# Patient Record
Sex: Female | Born: 1960 | Race: White | Hispanic: No | Marital: Married | Smoking: Never smoker
Health system: Southern US, Community
[De-identification: ages and names within clinical notes are randomized; demographics above are authoritative.]

## PROBLEM LIST (undated history)

## (undated) DIAGNOSIS — D279 Benign neoplasm of unspecified ovary: Secondary | ICD-10-CM

## (undated) DIAGNOSIS — R011 Cardiac murmur, unspecified: Secondary | ICD-10-CM

## (undated) DIAGNOSIS — N390 Urinary tract infection, site not specified: Secondary | ICD-10-CM

## (undated) DIAGNOSIS — L719 Rosacea, unspecified: Secondary | ICD-10-CM

## (undated) DIAGNOSIS — B019 Varicella without complication: Secondary | ICD-10-CM

## (undated) DIAGNOSIS — R6510 Systemic inflammatory response syndrome (SIRS) of non-infectious origin without acute organ dysfunction: Secondary | ICD-10-CM

## (undated) DIAGNOSIS — E119 Type 2 diabetes mellitus without complications: Secondary | ICD-10-CM

## (undated) DIAGNOSIS — I2699 Other pulmonary embolism without acute cor pulmonale: Secondary | ICD-10-CM

## (undated) DIAGNOSIS — I10 Essential (primary) hypertension: Secondary | ICD-10-CM

## (undated) HISTORY — DX: Benign neoplasm of unspecified ovary: D27.9

## (undated) HISTORY — DX: Rosacea, unspecified: L71.9

## (undated) HISTORY — DX: Systemic inflammatory response syndrome (sirs) of non-infectious origin without acute organ dysfunction: R65.10

## (undated) HISTORY — DX: Other pulmonary embolism without acute cor pulmonale: I26.99

## (undated) HISTORY — PX: TONSILLECTOMY: SUR1361

## (undated) HISTORY — DX: Type 2 diabetes mellitus without complications: E11.9

## (undated) HISTORY — DX: Essential (primary) hypertension: I10

## (undated) HISTORY — DX: Urinary tract infection, site not specified: N39.0

## (undated) HISTORY — DX: Cardiac murmur, unspecified: R01.1

## (undated) HISTORY — DX: Varicella without complication: B01.9

---

## 2016-02-19 DIAGNOSIS — D279 Benign neoplasm of unspecified ovary: Secondary | ICD-10-CM

## 2016-02-19 HISTORY — DX: Benign neoplasm of unspecified ovary: D27.9

## 2016-03-02 ENCOUNTER — Encounter: Payer: Self-pay | Admitting: Emergency Medicine

## 2016-03-02 ENCOUNTER — Emergency Department: Payer: 59

## 2016-03-02 ENCOUNTER — Emergency Department
Admission: EM | Admit: 2016-03-02 | Discharge: 2016-03-02 | DRG: 761 | Payer: 59 | Attending: Surgery | Admitting: Surgery

## 2016-03-02 ENCOUNTER — Inpatient Hospital Stay: Payer: 59

## 2016-03-02 DIAGNOSIS — R1900 Intra-abdominal and pelvic swelling, mass and lump, unspecified site: Secondary | ICD-10-CM | POA: Diagnosis present

## 2016-03-02 DIAGNOSIS — R109 Unspecified abdominal pain: Secondary | ICD-10-CM

## 2016-03-02 DIAGNOSIS — N852 Hypertrophy of uterus: Secondary | ICD-10-CM | POA: Insufficient documentation

## 2016-03-02 DIAGNOSIS — R1031 Right lower quadrant pain: Secondary | ICD-10-CM | POA: Diagnosis present

## 2016-03-02 DIAGNOSIS — R000 Tachycardia, unspecified: Secondary | ICD-10-CM | POA: Diagnosis not present

## 2016-03-02 DIAGNOSIS — R1903 Right lower quadrant abdominal swelling, mass and lump: Secondary | ICD-10-CM | POA: Diagnosis not present

## 2016-03-02 DIAGNOSIS — R509 Fever, unspecified: Secondary | ICD-10-CM | POA: Diagnosis not present

## 2016-03-02 DIAGNOSIS — K760 Fatty (change of) liver, not elsewhere classified: Secondary | ICD-10-CM | POA: Insufficient documentation

## 2016-03-02 DIAGNOSIS — K8020 Calculus of gallbladder without cholecystitis without obstruction: Secondary | ICD-10-CM | POA: Diagnosis not present

## 2016-03-02 DIAGNOSIS — D72829 Elevated white blood cell count, unspecified: Secondary | ICD-10-CM | POA: Diagnosis not present

## 2016-03-02 DIAGNOSIS — N838 Other noninflammatory disorders of ovary, fallopian tube and broad ligament: Secondary | ICD-10-CM

## 2016-03-02 DIAGNOSIS — A419 Sepsis, unspecified organism: Secondary | ICD-10-CM | POA: Diagnosis not present

## 2016-03-02 LAB — COMPREHENSIVE METABOLIC PANEL
ALT: 88 U/L — ABNORMAL HIGH (ref 14–54)
ANION GAP: 14 (ref 5–15)
AST: 61 U/L — AB (ref 15–41)
Albumin: 2.5 g/dL — ABNORMAL LOW (ref 3.5–5.0)
Alkaline Phosphatase: 89 U/L (ref 38–126)
BILIRUBIN TOTAL: 1.2 mg/dL (ref 0.3–1.2)
BUN: 9 mg/dL (ref 6–20)
CHLORIDE: 97 mmol/L — AB (ref 101–111)
CO2: 20 mmol/L — ABNORMAL LOW (ref 22–32)
Calcium: 8.8 mg/dL — ABNORMAL LOW (ref 8.9–10.3)
Creatinine, Ser: 0.58 mg/dL (ref 0.44–1.00)
Glucose, Bld: 324 mg/dL — ABNORMAL HIGH (ref 65–99)
POTASSIUM: 4 mmol/L (ref 3.5–5.1)
Sodium: 131 mmol/L — ABNORMAL LOW (ref 135–145)
TOTAL PROTEIN: 7.9 g/dL (ref 6.5–8.1)

## 2016-03-02 LAB — URINALYSIS COMPLETE WITH MICROSCOPIC (ARMC ONLY)
Bilirubin Urine: NEGATIVE
LEUKOCYTES UA: NEGATIVE
NITRITE: NEGATIVE
PH: 5 (ref 5.0–8.0)
Protein, ur: 30 mg/dL — AB
Specific Gravity, Urine: 1.025 (ref 1.005–1.030)

## 2016-03-02 LAB — PROTIME-INR
INR: 1.08
Prothrombin Time: 14.2 seconds (ref 11.4–15.0)

## 2016-03-02 LAB — CBC
HEMATOCRIT: 35.9 % (ref 35.0–47.0)
HEMOGLOBIN: 11.9 g/dL — AB (ref 12.0–16.0)
MCH: 31.4 pg (ref 26.0–34.0)
MCHC: 33.1 g/dL (ref 32.0–36.0)
MCV: 95 fL (ref 80.0–100.0)
Platelets: 277 10*3/uL (ref 150–440)
RBC: 3.78 MIL/uL — AB (ref 3.80–5.20)
RDW: 13.6 % (ref 11.5–14.5)
WBC: 19.6 10*3/uL — AB (ref 3.6–11.0)

## 2016-03-02 LAB — LACTIC ACID, PLASMA: LACTIC ACID, VENOUS: 0.8 mmol/L (ref 0.5–1.9)

## 2016-03-02 LAB — LIPASE, BLOOD: LIPASE: 18 U/L (ref 11–51)

## 2016-03-02 LAB — POCT PREGNANCY, URINE: PREG TEST UR: NEGATIVE

## 2016-03-02 MED ORDER — ONDANSETRON HCL 4 MG/2ML IJ SOLN: 4.0000 mg | Freq: Four times a day (QID) | INTRAMUSCULAR | Status: DC | PRN

## 2016-03-02 MED ORDER — PIPERACILLIN-TAZOBACTAM 3.375 G IVPB: 3.3750 g | Freq: Three times a day (TID) | INTRAVENOUS | Status: DC

## 2016-03-02 MED ORDER — ONDANSETRON 4 MG PO TBDP: 4.0000 mg | ORAL_TABLET | Freq: Four times a day (QID) | ORAL | Status: DC | PRN

## 2016-03-02 MED ORDER — ENOXAPARIN SODIUM 40 MG/0.4ML ~~LOC~~ SOLN: 40.0000 mg | SUBCUTANEOUS | Status: DC

## 2016-03-02 MED ORDER — SODIUM CHLORIDE 0.9 % IV BOLUS (SEPSIS): 1000.0000 mL | Freq: Once | INTRAVENOUS | Status: DC

## 2016-03-02 MED ORDER — MORPHINE SULFATE (PF) 4 MG/ML IV SOLN
INTRAVENOUS | Status: AC
  Administered 2016-03-02: 4 mg via INTRAVENOUS
  Filled 2016-03-02: qty 1

## 2016-03-02 MED ORDER — FAMOTIDINE IN NACL 20-0.9 MG/50ML-% IV SOLN: 20.0000 mg | Freq: Two times a day (BID) | INTRAVENOUS | Status: DC

## 2016-03-02 MED ORDER — HYDROMORPHONE HCL 1 MG/ML IJ SOLN
1.0000 mg | INTRAMUSCULAR | Status: DC | PRN
  Administered 2016-03-02: 1 mg via INTRAVENOUS
  Filled 2016-03-02: qty 1

## 2016-03-02 MED ORDER — ACETAMINOPHEN 325 MG PO TABS: 650.0000 mg | ORAL_TABLET | Freq: Four times a day (QID) | ORAL | Status: DC | PRN

## 2016-03-02 MED ORDER — HYDROCODONE-ACETAMINOPHEN 5-325 MG PO TABS: 1.0000 | ORAL_TABLET | ORAL | Status: DC | PRN

## 2016-03-02 MED ORDER — SODIUM CHLORIDE 0.9 % IV BOLUS (SEPSIS): 500.0000 mL | Freq: Once | INTRAVENOUS | Status: DC

## 2016-03-02 MED ORDER — DIATRIZOATE MEGLUMINE & SODIUM 66-10 % PO SOLN
15.0000 mL | Freq: Once | ORAL | Status: AC
  Administered 2016-03-02: 15 mL via ORAL

## 2016-03-02 MED ORDER — SODIUM CHLORIDE 0.9 % IV BOLUS (SEPSIS)
1000.0000 mL | INTRAVENOUS | Status: AC
  Administered 2016-03-02: 1000 mL via INTRAVENOUS

## 2016-03-02 MED ORDER — ACETAMINOPHEN 650 MG RE SUPP: 650.0000 mg | Freq: Four times a day (QID) | RECTAL | Status: DC | PRN

## 2016-03-02 MED ORDER — PIPERACILLIN-TAZOBACTAM 3.375 G IVPB 30 MIN
3.3750 g | Freq: Once | INTRAVENOUS | Status: AC
  Administered 2016-03-02: 3.375 g via INTRAVENOUS
  Filled 2016-03-02: qty 50

## 2016-03-02 MED ORDER — IOPAMIDOL (ISOVUE-300) INJECTION 61%
100.0000 mL | Freq: Once | INTRAVENOUS | Status: AC | PRN
  Administered 2016-03-02: 100 mL via INTRAVENOUS

## 2016-03-02 MED ORDER — MORPHINE SULFATE (PF) 4 MG/ML IV SOLN
4.0000 mg | Freq: Once | INTRAVENOUS | Status: AC
  Administered 2016-03-02: 4 mg via INTRAVENOUS

## 2016-03-02 MED ORDER — ONDANSETRON HCL 4 MG/2ML IJ SOLN
4.0000 mg | Freq: Once | INTRAMUSCULAR | Status: AC
  Administered 2016-03-02: 4 mg via INTRAVENOUS

## 2016-03-02 MED ORDER — ONDANSETRON HCL 4 MG/2ML IJ SOLN
INTRAMUSCULAR | Status: AC
  Administered 2016-03-02: 4 mg via INTRAVENOUS
  Filled 2016-03-02: qty 2

## 2016-03-02 MED ORDER — KCL IN DEXTROSE-NACL 20-5-0.45 MEQ/L-%-% IV SOLN
INTRAVENOUS | Status: DC
  Administered 2016-03-02: 13:00:00 via INTRAVENOUS
  Filled 2016-03-02: qty 1000

## 2016-03-02 NOTE — ED Notes (Signed)
MD at bedside. 

## 2016-03-02 NOTE — ED Notes (Signed)
Pt reports severe abd pain in right upper quad - Pt states she has been having this pain for 3 weeks off an on  - Pt was seen in [facility] walk in Wed and was dx with UTI and took one week worth of atb - pt reports that the medication did not help with the discomfort

## 2016-03-02 NOTE — H&P (Signed)
Kimberly Rowe is a 55 y.o. female  with right lower quadrant abdominal pain.  HPI: She was in her usual state of good health until approximately 3 weeks ago when she developed the gradual onset of right lower quadrant right flank pain. The pain was constant increasing over the course of time with no other significant symptoms. Specifically, she had no nausea vomiting diarrhea or constipation. She had no GYN bleeding. She had no urinary symptoms other than some mild frequency. She was seen by a walk-in clinic and diagnosed with possible urinary tract infection placed on 2 antibiotics. She completed antibiotics yesterday.  She began to develop increasing fevers last evening and the pain worsened. Because of the change in her symptoms she presented to the [facility] for further evaluation. She denies any previous significant GI problems. Specifically, she denies history of hepatitis, yellow jaundice, pancreatitis, peptic ulcer disease, gallbladder disease, or diverticulitis. Not had a recent GYN evaluation. She's not had a previous colonoscopy or upper endoscopy.  She does relate multiple chills and fevers over the last week to 10 days. Her fever has been mitigated by medications. She's had no previous abdominal surgery. She does not have regular family physician.  Workup in the emergency room revealed elevated white blood cell count of 19,000. CT scan revealed a large right lower quadrant mass consistent with possible ovarian tumor possible ruptured appendicitis. Surgical service was consulted.  History reviewed. No pertinent past medical history. History reviewed. No pertinent past surgical history. Social History   Social History  . Marital Status: Married    Spouse Name: N/A  . Number of Children: N/A  . Years of Education: N/A   Social History Main Topics  . Smoking status: Never Smoker   . Smokeless tobacco: None  . Alcohol Use: Yes     Comment: occ  . Drug Use: No  . Sexual Activity: Yes    Other Topics Concern  . None   Social History Narrative  . None     Review of Systems  Constitutional: Positive for fever, chills and malaise/fatigue.  HENT: Negative.   Eyes: Negative.   Respiratory: Negative for cough, shortness of breath and wheezing.   Cardiovascular: Negative for chest pain and palpitations.  Gastrointestinal: Negative for heartburn, nausea, vomiting, diarrhea and constipation.  Genitourinary: Positive for frequency. Negative for dysuria and urgency.  Musculoskeletal: Negative.   Skin: Negative.   Neurological: Negative.   Psychiatric/Behavioral: Negative.      PHYSICAL EXAM: BP 159/86 mmHg  Pulse 110  Temp(Src) 98.5 F (36.9 C) (Oral)  Resp 27  Ht 5\' 9"  (1.753 m)  Wt 107.049 kg (236 lb)  BMI 34.84 kg/m2  SpO2 95%  LMP 01/19/2016 (Exact Date)  Physical Exam  Constitutional: She is oriented to person, place, and time. She appears well-developed and well-nourished.  She is flushed  HENT:  Head: Normocephalic and atraumatic.  Eyes: EOM are normal. Pupils are equal, round, and reactive to light.  Neck: Normal range of motion. Neck supple.  Cardiovascular: Normal rate and regular rhythm.   Pulmonary/Chest: Effort normal and breath sounds normal. No respiratory distress. She has no wheezes.  Abdominal: Soft. Bowel sounds are normal. She exhibits distension and mass. There is tenderness. There is guarding.  Musculoskeletal: Normal range of motion. She exhibits no edema or tenderness.  Neurological: She is alert and oriented to person, place, and time.  Skin: Skin is warm. She is diaphoretic.  Psychiatric: She has a normal mood and affect. Thought content normal.  I can't palpate the mass in the right abdomen just above the iliac crest. She has mild tenderness and guarding with no rebound. She does have active bowel sounds.  Impression/Plan: I have independently reviewed her CT scan. Clinically she appears to have an abdominal infection with fever  or chills elevated white blood cell count of 3 week history. CT raises the question of possible ovarian mass and the solid component of this fluid collection does appear to be on a plane similar to the left ovary. I am reluctant to intervene with a percutaneous drainage procedure if this lesion is indeed an ovarian neoplasm. We will get a pelvic ultrasound and arrange for GYN to see her prior to any other intervention. I discussed the problem with the patient and her husband demonstrated the CT scan with the findings to them and they're in agreement with the current plan. We will place her on antibiotic therapy as we worked this problem.   Dia Crawford III, MD  03/02/2016, 10:06 AM

## 2016-03-02 NOTE — ED Notes (Signed)
Dr. Ely at bedside.

## 2016-03-02 NOTE — ED Provider Notes (Addendum)
[facility] Emergency Department Provider Note  ____________________________________________  Time seen: Approximately 6:17 AM  I have reviewed the triage vital signs and the nursing notes.   HISTORY  Chief Complaint Abdominal Pain    HPI Kimberly Rowe is a 55 y.o. female with no significant past medical history who presents for evaluation of severe right-sided abdominal pain worse in the right upper quadrant.  She reports that she has been having pain off and on for 3 weeks but that is much more severe tonight.  She does not note any association between eating and the pain.  Nothing particular makes it better nor worse.  She describes it as both a sharp and aching pain in the right side of her abdomen.  She denies nausea, vomiting, chest pain, shortness of breath.  He did not have any dysuria but was seen in a walk-in clinic last week and diagnosed with a UTI and taken a week's worth of antibiotics.  She reports having low-grade fevers intermittently for about 3 weeks, no higher than 100.6.  Ibuprofen brings the fever down and also helped her pain until last night, but nothing was helping at that point.   History reviewed. No pertinent past medical history.  There are no active problems to display for this patient.   History reviewed. No pertinent past surgical history.  No current outpatient prescriptions on file.  Allergies Review of patient's allergies indicates no known allergies.  History reviewed. No pertinent family history.  Social History Social History  Substance Use Topics  . Smoking status: Never Smoker   . Smokeless tobacco: None  . Alcohol Use: Yes     Comment: occ    Review of Systems Constitutional: Low grade fevers Eyes: No visual changes. ENT: No sore throat. Cardiovascular: Denies chest pain. Respiratory: Denies shortness of breath. Gastrointestinal: +abdominal pain.  No nausea, no vomiting.  No diarrhea.  No  constipation. Genitourinary: Negative for dysuria. Musculoskeletal: Negative for back pain. Skin: Negative for rash. Neurological: Negative for headaches, focal weakness or numbness.  10-point ROS otherwise negative.  ____________________________________________   PHYSICAL EXAM:  VITAL SIGNS: ED Triage Vitals  Enc Vitals Group     BP 03/02/16 0542 159/88 mmHg     Pulse Rate 03/02/16 0542 121     Resp 03/02/16 0542 16     Temp 03/02/16 0542 98.5 F (36.9 C)     Temp Source 03/02/16 0542 Oral     SpO2 03/02/16 0542 96 %     Weight 03/02/16 0542 236 lb (107.049 kg)     Height 03/02/16 0542 5\' 9"  (1.753 m)     Head Cir --      Peak Flow --      Pain Score 03/02/16 0537 8     Pain Loc --      Pain Edu? --      Excl. in [place]? --     Constitutional: Alert and oriented. Well appearing and in no acute distress. Eyes: Conjunctivae are normal. PERRL. EOMI. Head: Atraumatic. Nose: No congestion/rhinnorhea. Mouth/Throat: Mucous membranes are moist.  Oropharynx non-erythematous. Neck: No stridor.  No meningeal signs.   Cardiovascular: Normal rate, regular rhythm. Good peripheral circulation. Grossly normal heart sounds.   Respiratory: Normal respiratory effort.  No retractions. Lungs CTAB. Gastrointestinal: Obese.  Soft with significant tenderness to palpation of the right side of the abdomen and specifically of the right upper quadrant with +Murphy's sign Musculoskeletal: No lower extremity tenderness nor edema. No gross deformities  of extremities. Neurologic:  Normal speech and language. No gross focal neurologic deficits are appreciated.  Skin:  Skin is warm, dry and intact. No rash noted. Psychiatric: Mood and affect are normal. Speech and behavior are normal.  ____________________________________________   LABS (all labs ordered are listed, but only abnormal results are displayed)  Labs Reviewed  COMPREHENSIVE METABOLIC PANEL - Abnormal; Notable for the following:    Sodium  131 (*)    Chloride 97 (*)    CO2 20 (*)    Glucose, Bld 324 (*)    Calcium 8.8 (*)    Albumin 2.5 (*)    AST 61 (*)    ALT 88 (*)    All other components within normal limits  CBC - Abnormal; Notable for the following:    WBC 19.6 (*)    RBC 3.78 (*)    Hemoglobin 11.9 (*)    All other components within normal limits  URINALYSIS COMPLETEWITH MICROSCOPIC ([facility] ONLY) - Abnormal; Notable for the following:    Color, Urine YELLOW (*)    APPearance HAZY (*)    Glucose, UA >500 (*)    Ketones, ur 2+ (*)    Hgb urine dipstick 3+ (*)    Protein, ur 30 (*)    Bacteria, UA RARE (*)    Squamous Epithelial / LPF 6-30 (*)    All other components within normal limits  CULTURE, BLOOD (ROUTINE X 2)  CULTURE, BLOOD (ROUTINE X 2)  URINE CULTURE  LIPASE, BLOOD  LACTIC ACID, PLASMA  LACTIC ACID, PLASMA  PROTIME-INR  POC URINE PREG, ED  POCT PREGNANCY, URINE   ____________________________________________  EKG  ED ECG REPORT I, [place], the attending physician, personally viewed and interpreted this ECG.  Date: 03/02/2016 EKG Time: 07:32 Rate: 115 Rhythm: sinus tachycardia QRS Axis: RAD Intervals: normal ST/T Wave abnormalities: normal Conduction Disturbances: none Narrative Interpretation: unremarkable  ____________________________________________  RADIOLOGY   US Abdomen Limited Ruq  03/02/2016  CLINICAL DATA:  Right upper quadrant pain off and on for 3-4 weeks. EXAM: US ABDOMEN LIMITED - RIGHT UPPER QUADRANT COMPARISON:  None. FINDINGS: Gallbladder: Multiple echogenic stones are identified measuring up to 1 cm diameter. No gallbladder wall thickness. Sonographer reports no sonographic Murphy sign. Common bile duct: Diameter: 4 mm Liver: Increased echogenicity with poor through transmission compatible with steatosis. Note: Sonographer Identifies a 13 x 11 x 14 cm complex cystic lesion in the right lower quadrant, the region of pain as reported by the patient. IMPRESSION:  1. 14 cm complex cystic mass identified in the right lower quadrant as identified as the area of concern by the patient. CT imaging of the abdomen and pelvis with oral and intravenous contrast recommended to further evaluate. 2. Cholelithiasis. 3. Steatosis Electronically Signed   By: Misty Stanley M.D.   On: 03/02/2016 07:09    ____________________________________________   PROCEDURES  Procedure(s) performed:   .Critical Care Performed by: Hinda Kehr Authorized by: Hinda Kehr Total critical care time: 30 minutes Critical care time was exclusive of separately billable procedures and treating other patients. Critical care was necessary to treat or prevent imminent or life-threatening deterioration of the following conditions: sepsis. Critical care was time spent personally by me on the following activities: development of treatment plan with patient or surrogate, discussions with consultants, evaluation of patient's response to treatment, examination of patient, obtaining history from patient or surrogate, ordering and performing treatments and interventions, ordering and review of laboratory studies, ordering and review of radiographic studies, pulse  oximetry, re-evaluation of patient's condition and review of old charts.     ____________________________________________   INITIAL IMPRESSION / ASSESSMENT AND PLAN / ED COURSE  Pertinent labs & imaging results that were available during my care of the patient were reviewed by me and considered in my medical decision making (see chart for details).  The patient was tachycardic in the 120s when she arrived but afebrile.  I have ordered 1 L of fluids, labs are pending, and I ordered an ultrasound.  If her ultrasound is unremarkable and does not show a reason for her tenderness I we will proceed with a CT of the abdomen and pelvis with oral and IV contrast.  She has no genitourinary symptoms and there is no indication for pelvic exam at  this time.  She currently is declining any pain medicine or nausea medicine.  ----------------------------------------- 7:27 AM on 03/02/2016 -----------------------------------------  Labs are concerning with a leukocytosis > 19.  AST and ALT also slightly elevated.  U/S shows no biliary pathology but a large complex cystic structure in the abdomen.  I am concerned for an intraabdominal abscess.  Since we now have a leukocytosis, she meets sepsis criteria - activating Code Sepsis, ordering empiric antibiotics (Zosyn), 30 mL/kg of IVF, ordering lactate, ordering CT abd/pelvis for further evaluation.  Patient now with more pain, giving morphine and zofran.  Transferring ED care to Dr. Reita Cliche to follow up and reassess.  ____________________________________________  FINAL CLINICAL IMPRESSION(S) / ED DIAGNOSES  Final diagnoses:  Right sided abdominal pain  Sepsis, due to unspecified organism ([place])     MEDICATIONS GIVEN DURING THIS VISIT:  Medications  sodium chloride 0.9 % bolus 1,000 mL (not administered)    And  sodium chloride 0.9 % bolus 1,000 mL (not administered)    And  sodium chloride 0.9 % bolus 500 mL (not administered)  piperacillin-tazobactam (ZOSYN) IVPB 3.375 g (not administered)  diatrizoate meglumine-sodium (GASTROGRAFIN) 66-10 % solution 15 mL (not administered)  sodium chloride 0.9 % bolus 1,000 mL (1,000 mLs Intravenous New Bag/Given 03/02/16 0657)     NEW OUTPATIENT MEDICATIONS STARTED DURING THIS VISIT:  New Prescriptions   No medications on file      Note:  This document was prepared using Dragon voice recognition software and may include unintentional dictation errors.   Hinda Kehr, MD 03/02/16 [place], MD 03/02/16 [phone]

## 2016-03-02 NOTE — Progress Notes (Signed)
Pharmacy Antibiotic Note  Kimberly Rowe is a 55 y.o. female admitted on 03/02/2016 with sepsis.  Pharmacy has been consulted for piperacillin-tazobactam dosing. Patient received pipercillin-tazobactam 3.375 g IV over 30 min once in the ER.  Plan: Zosyn 3.375g IV q8h (4 hour infusion).  Height: 5\' 9"  (175.3 cm) Weight: 236 lb (107.049 kg) IBW/kg (Calculated) : 66.2  Temp (24hrs), Avg:98.5 F (36.9 C), Min:98.5 F (36.9 C), Max:98.5 F (36.9 C)   Recent Labs Lab 03/02/16 0549 03/02/16 0748  WBC 19.6*  --   CREATININE 0.58  --   LATICACIDVEN  --  0.8    Estimated Creatinine Clearance: 105.9 mL/min (by C-G formula based on Cr of 0.58).    No Known Allergies  Antimicrobials this admission: Piperacillin-tazobactam 07/13 >>   Microbiology results: 07/13 BCx: Sent 07/13 UCx: Sent   Thank you for allowing pharmacy to be a part of this patient's care.  Darrow Bussing, PharmD 03/02/2016 12:41 PM

## 2016-03-02 NOTE — ED Notes (Signed)
Pt presents to ED with c/o right lower quadrant abdominal pain, onset x3 weeks with fever. Pt denies N/V/D or urinary symptoms at this time. Pt states she was diagnosed with UTI about a week ago; finished last  Antibiotic Tx yesterday.

## 2016-03-02 NOTE — ED Notes (Signed)
Report from Megan, RN

## 2016-03-02 NOTE — ED Notes (Signed)
Pt in [place] will hang NACL when she returns

## 2016-03-02 NOTE — ED Notes (Signed)
This RN introduced self to patient. Pt appears in no obvious distressed. Explained plan of care of care to patient. Pt states understanding at this time.

## 2016-03-02 NOTE — ED Provider Notes (Signed)
[facility]  I accepted care from Dr. Karma Greaser ____________________________________________    LABS (pertinent positives/negatives)  Labs Reviewed  COMPREHENSIVE METABOLIC PANEL - Abnormal; Notable for the following:    Sodium 131 (*)    Chloride 97 (*)    CO2 20 (*)    Glucose, Bld 324 (*)    Calcium 8.8 (*)    Albumin 2.5 (*)    AST 61 (*)    ALT 88 (*)    All other components within normal limits  CBC - Abnormal; Notable for the following:    WBC 19.6 (*)    RBC 3.78 (*)    Hemoglobin 11.9 (*)    All other components within normal limits  URINALYSIS COMPLETEWITH MICROSCOPIC ([facility] ONLY) - Abnormal; Notable for the following:    Color, Urine YELLOW (*)    APPearance HAZY (*)    Glucose, UA >500 (*)    Ketones, ur 2+ (*)    Hgb urine dipstick 3+ (*)    Protein, ur 30 (*)    Bacteria, UA RARE (*)    Squamous Epithelial / LPF 6-30 (*)    All other components within normal limits  CULTURE, BLOOD (ROUTINE X 2)  CULTURE, BLOOD (ROUTINE X 2)  URINE CULTURE  LIPASE, BLOOD  LACTIC ACID, PLASMA  PROTIME-INR  CBC  CREATININE, SERUM  POC URINE PREG, ED  POCT PREGNANCY, URINE      ____________________________________________    RADIOLOGY All xrays were viewed by me.  I discussed this case with the radiologist. Imaging interpreted by radiologist.  CT abdomen and pelvis with contrast:IMPRESSION: 1. 15 cm complex cystic and solid lesion in the right abdomen has multiple loculations of varying attenuation and some demonstrate rim calcification. Gonadal vasculature on the right tracks into the inferior portion of this lesion. 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 However, the lesion appears to incorporate the cecal tip and the appendix cannot be visualized. Primary GI etiology such as perforated appendicitis or large cecal/appendiceal neoplasm are considered possible, but less likely. Given the rim calcification in some  areas and soft tissue components, features are not typical for tubo-ovarian abscess. 2. Markedly enlarged uterus secondary to fibroid change.  Electronically Signed By: Misty Stanley M.D. ____________________________________________   PROCEDURES  Procedure(s) performed: None  Critical Care performed: None  ____________________________________________   INITIAL IMPRESSION / ASSESSMENT AND PLAN / ED COURSE   Pertinent labs & imaging results that were available during my care of the patient were reviewed by me and considered in my medical decision making (see chart for details).  I discussed ct results with radiologist -- unclear etiology, but consider tumor of ovarian source or colon source, or ruptured appendix.  Ultimately the patient is well-appearing without hypertension or elevated lactate and I think sepsis is unlikely.  I gave the patient and her spouse the CT results. I spoke with Dr. Star Age with OB/GYN who stated that here at [facility] a gynecologic oncologist surgeon would not be available until next Wednesday, which is likely too long to delay evaluation and recommends transfer to tertiary care center.  Dr. Pat Patrick with Gen. surgery did evaluate the patient and was willing to admit, but given that by an gyn onc surgery is not available for one week, agreed that transfer is the best choice for this patient.   I spoke to the [facility] transfer center and discussed the case with Dr. Warden Fillers, general surgery who will accept the patient to be  transferred to the [facility] emergency Department where an evaluation and discussion of service and additional workup will take place.  CONSULTATIONS: Dr. Pat Patrick, general surgery and Dr. Georgianne Fick ob gyn.  Dr. Rolena Infante, [facility] surgery accepted in transfer.    Patient / Family / Caregiver informed of clinical course, medical decision-making process, and agree with plan.     ____________________________________________   FINAL CLINICAL  IMPRESSION(S) / ED DIAGNOSES  Final diagnoses:  Right sided abdominal pain  Sepsis, due to unspecified organism [facility])  Ovarian mass, right        Lisa Roca, MD 03/02/16 1234

## 2016-03-02 NOTE — ED Notes (Addendum)
Pt now to be transferred, not admitted.  Pt verbalizes understanding.

## 2016-03-03 LAB — URINE CULTURE

## 2016-03-06 HISTORY — PX: ABDOMINAL HYSTERECTOMY: SHX81

## 2016-03-07 LAB — CULTURE, BLOOD (ROUTINE X 2)
CULTURE: NO GROWTH
Culture: NO GROWTH

## 2016-03-22 ENCOUNTER — Inpatient Hospital Stay: Payer: 59 | Attending: Obstetrics and Gynecology | Admitting: Obstetrics and Gynecology

## 2016-03-22 ENCOUNTER — Telehealth: Payer: Self-pay | Admitting: *Deleted

## 2016-03-22 ENCOUNTER — Encounter: Payer: Self-pay | Admitting: Obstetrics and Gynecology

## 2016-03-22 VITALS — BP 169/108 | HR 105

## 2016-03-22 DIAGNOSIS — D279 Benign neoplasm of unspecified ovary: Secondary | ICD-10-CM | POA: Insufficient documentation

## 2016-03-22 DIAGNOSIS — E119 Type 2 diabetes mellitus without complications: Secondary | ICD-10-CM | POA: Insufficient documentation

## 2016-03-22 DIAGNOSIS — R011 Cardiac murmur, unspecified: Secondary | ICD-10-CM | POA: Diagnosis not present

## 2016-03-22 DIAGNOSIS — Z90710 Acquired absence of both cervix and uterus: Secondary | ICD-10-CM | POA: Diagnosis not present

## 2016-03-22 DIAGNOSIS — Z90722 Acquired absence of ovaries, bilateral: Secondary | ICD-10-CM | POA: Insufficient documentation

## 2016-03-22 DIAGNOSIS — N7003 Acute salpingitis and oophoritis: Secondary | ICD-10-CM | POA: Diagnosis not present

## 2016-03-22 DIAGNOSIS — D270 Benign neoplasm of right ovary: Secondary | ICD-10-CM | POA: Insufficient documentation

## 2016-03-22 DIAGNOSIS — I10 Essential (primary) hypertension: Secondary | ICD-10-CM | POA: Diagnosis not present

## 2016-03-22 NOTE — Telephone Encounter (Signed)
Contacted husband regarding BP recheck he reports BP is now 138/80 per home device. He also reconciled medications with me utilizing wife's pill bottles. See med list.

## 2016-03-22 NOTE — Progress Notes (Signed)
Patient here for post op follow up and suture removal. Patient BP is elevated advised her to monitor it daily and notify PCP if it remains elevated.  Sutures removed steri strips applied patient instructed to leave strips in place and let fall off naturally.

## 2016-03-22 NOTE — Progress Notes (Signed)
Gynecologic Oncology  Visit     Chief Concern: staple and drain removal  Subjective:  Kimberly Rowe is a 55 y.o. female who was admitted through the [place] with RLQ pain, fevers, and leukocytosis who was found to have a large pelvic mass concerning for ovarian neoplasm and/or an intra-abdominal infectious process with SIRS. She underwent uncomplicated Ex-lap, TAH, RSO, and drain placement on 03/06/16 for grossly infected benign ovarian mass. She was treated with IV antibiotics that were transitioned to a course of oral flagyl/augmentin.  Patient reports no PMH although diagnosed during hospitalization with hypertension and diabetes.   Postoperatively she had a left PE diagnosed on CT/PE on 03/10/16. She was started heparin drip, transitioned to Lovenox, and later to Eloquis. She is supposed to follow up with Dr. Derrel Nip.    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  DIAGNOSIS A. Uterus (1692 grams), hysterectomy:  Endometrium: Atrophic endometrium. Myometrium: Leiomyomata. Endocervix: Endocervical polyp and microglandular hyperplasia. Serosa: No pathologic diagnosis.   B. Cervix, excision:  Benign cervix. No dysplasia or carcinoma is seen.   C. 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 Fallopian tube: Acute and chronic salpingitis with serositis. Negative for malignancy.   D. Inflammatory rind, excision:   Fibroadipose tissue with acute and chronic inflammation.   E. Left fallopian tube, salpingectomy:  Benign fallopian tube with acute and chronic salpingitis with acute serositis.    Cytology: Negative.   She has done well since discharge. Drain output minimally 20 cc/3 days. No fever, chills, or other concerns. She is anxious about the appointment but has no other complaints.        Problem List: Patient Active Problem  List   Diagnosis Date Noted  . 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 Pelvic mass in female 03/02/2016    Past Medical History: Past Medical History:  Diagnosis Date  . Diabetes mellitus without complication ([place])   . Heart murmur   . Hypertension   . 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 Struma ovarii 02/2016           Past Surgical History: Past Surgical History:  Procedure Laterality Date  . ABDOMINAL HYSTERECTOMY  03/06/2016   Ex-lap, TAH, RSO, and drain placement on 03/06/16 for grossly infected benign ovarian mass.           Past Gynecologic History:  As per HPI  Social History: Social History   Social History  . Marital status: Married    Spouse name: N/A  . Number of children: N/A  . Years of education: N/A   Occupational History  . Not on file.   Social History Main Topics  . Smoking status: Never Smoker  . Smokeless tobacco: Not on file  . Alcohol use Yes     Comment: occ  . Drug use: No  . Sexual activity: Yes   Other Topics Concern  . Not on file   Social History Narrative  . No narrative on file    Allergies: No Known Allergies   Review of Systems As per HPI  Objective:  Physical Examination:  BP (!) 169/108 (BP Location: Right Arm, Patient Position: Sitting) Comment: patient will monitor BP at home and notify PCP if it remains elevated.  Pulse (!) 105     BP retake at home improved.    ECOG Performance  Status: 0 - Asymptomatic  General appearance: alert, cooperative and appears stated age HEENT:PERRLA and extra ocular movement intact Abdomen: soft, non-tender, without masses or organomegaly, no hernias and well healed incision. Staples removed and reinforced with steri strips.  Neurological exam reveals alert, oriented, normal speech, no focal findings or movement disorder noted.  Pelvic: deferred  Drains removed without complication; sterile dressing applied.         Assessment:  Kimberly Rowe is a 55 y.o. female diagnosed with struma ovarii s/p XL, TAH, RSO, and drain placement on 03/06/16.  Medical co-morbidities complicating care: PE, HTN, diabetes and obesity.  Plan:   Problem List Items Addressed This Visit      Genitourinary   Struma ovarii - Primary    Other Visit Diagnoses   None.     She will follow up with Dr. Fransisca Connors as scheduled for her postoperative visit.  We will try to help arrange for her appointment with Dr. Derrel Nip to manage her BP, DM, and anticoagulation.   Gillis Ends, MD    CC:  No referring provider defined for this encounter.

## 2016-03-24 ENCOUNTER — Encounter: Payer: Self-pay | Admitting: Obstetrics and Gynecology

## 2016-04-07 ENCOUNTER — Other Ambulatory Visit: Payer: Self-pay | Admitting: *Deleted

## 2016-04-07 NOTE — Telephone Encounter (Signed)
Advised patient to call [facility] for this refill as we did not order this for her. I gave her the number to Dr Roxy Horseman office

## 2016-04-19 ENCOUNTER — Inpatient Hospital Stay: Payer: 59 | Admitting: Obstetrics and Gynecology

## 2016-04-19 ENCOUNTER — Encounter (INDEPENDENT_AMBULATORY_CARE_PROVIDER_SITE_OTHER): Payer: Self-pay

## 2016-04-19 VITALS — BP 160/97 | HR 97 | Temp 99.1°F | Wt 217.0 lb

## 2016-04-19 DIAGNOSIS — R1900 Intra-abdominal and pelvic swelling, mass and lump, unspecified site: Secondary | ICD-10-CM

## 2016-04-19 NOTE — Progress Notes (Signed)
  Oncology Nurse Navigator Documentation  Navigator Location: [facility] (04/19/16 1400) Navigator Encounter Type: Follow-up Appt (04/19/16 1400)                                          Time Spent with Patient: 30 (04/19/16 1400)   Chaperoned pelvic exam. No Gyn Onc follow up needed unless any issues arise. Has Hematology appt for anticoag regulation and internal med for primary care to include BP and diabetes.

## 2016-04-19 NOTE — Progress Notes (Signed)
Patient ambulates without assistance, brought to exam room 14 accompanied by her husband Patient denies pain or discomfort. BP 106/97, HR 97 Tamp 99.1

## 2016-04-19 NOTE — Progress Notes (Signed)
Gynecologic Oncology  Visit   Chief Concern: Post op exam  Subjective:  Kimberly Rowe is a 55 y.o. female who was admitted through the [facility] with RLQ pain, fevers, and leukocytosis who was found to have a large pelvic mass concerning for ovarian neoplasm and/or an intra-abdominal infectious process with SIRS. She underwent uncomplicated Ex-lap, TAH, RSO, and drain placement on 03/06/16 for grossly infected benign ovarian mass. She was treated with IV antibiotics that were transitioned to a course of oral flagyl/augmentin.  Patient reports no PMH although diagnosed during hospitalization with hypertension and diabetes.   Postoperatively she had a left PE diagnosed on CT/PE on 03/10/16. She was started heparin drip, transitioned to Lovenox, and later to Eloquis. She is supposed to follow up with Dr. Derrel Nip.   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 Uterus negative.  Cytology: Negative.   She has done well since discharge and was seen for removal of drain by Dr Theora Gianotti 8/2.  No fever, chills, or other concerns since then.   Problem List: Patient Active Problem List   Diagnosis Date Noted  . 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 Pelvic mass in female 03/02/2016    Past Medical History: Past Medical History:  Diagnosis Date  . Diabetes mellitus without complication ([place])   . Heart murmur   . Hypertension   . 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 Struma ovarii 02/2016    Past Surgical History: Past Surgical History:  Procedure Laterality Date  . ABDOMINAL HYSTERECTOMY  03/06/2016   Ex-lap, TAH, RSO, and drain placement on 03/06/16 for grossly infected benign ovarian mass.     Past Gynecologic History:  As per HPI  Social History: Social History   Social History  . Marital status: Married    Spouse name: N/A  . Number of children: N/A  . Years of education: N/A   Occupational History  . Not  on file.   Social History Main Topics  . Smoking status: Never Smoker  . Smokeless tobacco: Not on file  . Alcohol use Yes     Comment: occ  . Drug use: No  . Sexual activity: Yes   Other Topics Concern  . Not on file   Social History Narrative  . No narrative on file    Allergies: No Known Allergies   Current Outpatient Prescriptions:  .  metFORMIN (GLUCOPHAGE-XR) 500 MG 24 hr tablet, 500 mg., Disp: , Rfl:  .  apixaban (ELIQUIS) 5 MG TABS tablet, Take by mouth., Disp: , Rfl:  .  insulin glargine (LANTUS) 100 UNIT/ML injection, Inject 40 Units into the skin at bedtime., Disp: , Rfl:  .  linagliptin (TRADJENTA) 5 MG TABS tablet, Take by mouth., Disp: , Rfl:    Review of Systems As per HPI  Objective:  Physical Examination:   Vitals:   04/19/16 1423  BP: (!) 160/97  Pulse: 97  Temp: 99.1 F (37.3 C)      ECOG Performance Status: 0 - Asymptomatic  General appearance: alert, cooperative and appears stated age HEENT:PERRLA and extra ocular movement intact Abdomen: soft, non-tender, without masses or organomegaly, no hernias and well healed incision.   Neurological exam reveals alert, oriented, normal speech, no focal findings or movement disorder noted.  Pelvic: EGBUS: normal, vagina: cuff well healed, bimanual: no masses.     Assessment:  Kimberly Rowe is a 55 y.o. female diagnosed with dermoid with struma  ovarii s/p XL, TAH, RSO, and drain placement on 03/06/16.   Medical co-morbidities complicating care: PE post op, HTN, diabetes and obesity.   BP remains high today.  She is not on any medications, and says her BP is normal at home and at pharmacy.  Plan:   Problem List Items Addressed This Visit      Other   Pelvic mass in female - Primary    Other Visit Diagnoses   None.    She will follow up with medical doctor (Dr Lacinda Axon) for her BP (she says it is normal at home) and DM.  She has an appointment in the [place] clinic next week for follow up of  her anticoagulation for post op PE.   We can see her back in the future as needed.  She may return to work now.   Mellody Drown, MD  CC:  Coral Spikes, DO [address] [place], [place] [address]

## 2016-05-01 ENCOUNTER — Ambulatory Visit: Payer: 59 | Admitting: Family Medicine

## 2016-05-03 ENCOUNTER — Ambulatory Visit (INDEPENDENT_AMBULATORY_CARE_PROVIDER_SITE_OTHER): Payer: 59 | Admitting: Family Medicine

## 2016-05-03 ENCOUNTER — Encounter: Payer: Self-pay | Admitting: Family Medicine

## 2016-05-03 VITALS — BP 150/89 | HR 92 | Temp 98.2°F | Ht 69.0 in | Wt 219.2 lb

## 2016-05-03 DIAGNOSIS — Z1239 Encounter for other screening for malignant neoplasm of breast: Secondary | ICD-10-CM | POA: Diagnosis not present

## 2016-05-03 DIAGNOSIS — D72829 Elevated white blood cell count, unspecified: Secondary | ICD-10-CM | POA: Diagnosis not present

## 2016-05-03 DIAGNOSIS — H0260 Xanthelasma of unspecified eye, unspecified eyelid: Secondary | ICD-10-CM

## 2016-05-03 DIAGNOSIS — I2699 Other pulmonary embolism without acute cor pulmonale: Secondary | ICD-10-CM | POA: Insufficient documentation

## 2016-05-03 DIAGNOSIS — I10 Essential (primary) hypertension: Secondary | ICD-10-CM | POA: Insufficient documentation

## 2016-05-03 DIAGNOSIS — E119 Type 2 diabetes mellitus without complications: Secondary | ICD-10-CM

## 2016-05-03 LAB — COMPREHENSIVE METABOLIC PANEL
ALT: 30 U/L (ref 0–35)
AST: 20 U/L (ref 0–37)
Albumin: 3.8 g/dL (ref 3.5–5.2)
Alkaline Phosphatase: 62 U/L (ref 39–117)
BUN: 17 mg/dL (ref 6–23)
CALCIUM: 9.5 mg/dL (ref 8.4–10.5)
CHLORIDE: 106 meq/L (ref 96–112)
CO2: 27 meq/L (ref 19–32)
Creatinine, Ser: 0.74 mg/dL (ref 0.40–1.20)
GFR: 87.07 mL/min (ref >60.00)
GLUCOSE: 100 mg/dL — AB (ref 70–99)
POTASSIUM: 4.1 meq/L (ref 3.5–5.1)
Sodium: 139 mEq/L (ref 135–145)
Total Bilirubin: 0.4 mg/dL (ref 0.2–1.2)
Total Protein: 7.6 g/dL (ref 6.0–8.3)

## 2016-05-03 LAB — CBC
HCT: 37.9 % (ref 36.0–46.0)
HEMOGLOBIN: 12.9 g/dL (ref 12.0–15.0)
MCHC: 34.1 g/dL (ref 30.0–36.0)
MCV: 92.5 fl (ref 78.0–100.0)
PLATELETS: 237 10*3/uL (ref 150.0–400.0)
RBC: 4.1 Mil/uL (ref 3.87–5.11)
RDW: 15.7 % — AB (ref 11.5–15.5)
WBC: 5.2 10*3/uL (ref 4.0–10.5)

## 2016-05-03 LAB — LDL CHOLESTEROL, DIRECT: LDL DIRECT: 109 mg/dL

## 2016-05-03 LAB — LIPID PANEL
CHOL/HDL RATIO: 5
Cholesterol: 189 mg/dL (ref 0–200)
HDL: 42 mg/dL (ref >39.00)
NONHDL: 147.11
TRIGLYCERIDES: 209 mg/dL — AB (ref 0.0–149.0)
VLDL: 41.8 mg/dL — AB (ref 0.0–40.0)

## 2016-05-03 LAB — MICROALBUMIN / CREATININE URINE RATIO
Creatinine,U: 74.9 mg/dL
Microalb Creat Ratio: 0.9 mg/g (ref 0.0–30.0)

## 2016-05-03 MED ORDER — LINAGLIPTIN 5 MG PO TABS: 5.0000 mg | ORAL_TABLET | Freq: Every day | ORAL | 3 refills | Status: DC

## 2016-05-03 MED ORDER — LISINOPRIL 10 MG PO TABS: 10.0000 mg | ORAL_TABLET | Freq: Every day | ORAL | 3 refills | Status: DC

## 2016-05-03 NOTE — Progress Notes (Signed)
Subjective:  Patient ID: Kimberly Rowe, female    DOB: April 25, 1961  Age: 55 y.o. MRN: [id number]  CC: Establish care (issues are below).  HPI Kimberly Rowe is a 55 y.o. female presents to the clinic today to establish care. Issues/concerns are below.  DM-2  Stable. Recent new diagnosis (during hospitalization 02/2016).  Blood sugars readings - Fastings 130's.  Hypoglycemia - No.  Medications - currently on Metformin 1000 mg BID, Tradjenta 5 mg daily. No longer taking insulin.  Adverse effects - Nausea with metformin.  Compliance - Yes.  Preventative care  Eye exam - In need of.  Foot exam - In need of.  Last A1C - 12.5 (03/03/16).  Urine microalbumin - In need of.  Elevated BP  Per the notes in EMR, patient has had elevated blood pressure. She is not currently on treatment for hypertension.  BP is elevated today.  Patient also has uncontrolled diabetes.  Need to discuss treatment options today.  PE  Patient found to have pulmonary embolism during recent hospitalization.  She is currently on Eliquis.  No reports of chest pain or shortness of breath.  She endorses compliance with medication. She has seen hematology. She has follow-up in October to discuss length of treatment.  PMH, Surgical Hx, Family Hx, Social History reviewed and updated as below.  Past Medical History:  Diagnosis Date  . Diabetes mellitus without complication ([place])   . Heart murmur   . Hypertension   . 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 ABDOMINAL HYSTERECTOMY  03/06/2016   Ex-lap, TAH, RSO, and drain placement on 03/06/16 for grossly infected benign ovarian mass.  . TONSILLECTOMY     Family History  Problem Relation Age of Onset  . Prostate cancer Father   . Arthritis Maternal Grandfather    Social History  Substance Use Topics   . Smoking status: Never Smoker  . Smokeless tobacco: Never Used  . Alcohol use Yes     Comment: occ   Review of Systems General: Denies unexplained weight loss, fever. Skin: Denies new or changing mole, sore/wound that won't heal. ENT: Trouble hearing, ringing in the ears, sores in the mouth, hoarseness, trouble swallowing. Eyes: Denies trouble seeing/visual disturbance. Heart/CV: Denies chest pain, shortness of breath, edema, palpitations. Lungs/Resp: Denies cough, shortness of breath, hemoptysis. Abd/GI: Denies nausea, vomiting, diarrhea, constipation, abdominal pain, hematochezia, melena. GU: Denies dysuria, incontinence, hematuria, urinary frequency, difficulty starting/keeping stream, vaginal discharge, sexual difficulty, lump in breasts. MSK: Denies joint pain/swelling, myalgias. Neuro: Denies headaches, weakness, numbness, dizziness, syncope. Psych: Denies sadness, anxiety, stress, memory difficulty. Endocrine: Denies polyuria and polydipsia.  Objective:   Today's Vitals: BP (!) 150/89 (BP Location: Left Arm, Patient Position: Sitting, Cuff Size: Large)   Pulse 92   Temp 98.2 F (36.8 C) (Oral)   Ht _0  (1.753 m) Comment: with shoes  Wt 219 lb 4 oz (99.5 kg)   SpO2 97%   BMI 32.38 kg/m   Physical Exam  Constitutional: She is oriented to person, place, and time. She appears well-developed. No distress.  HENT:  Head: Normocephalic and atraumatic.  Mouth/Throat: Oropharynx is clear and moist.  Xanthelasma noted.  Eyes: Conjunctivae are normal. No scleral icterus.  Neck: Neck supple.  Cardiovascular: Normal rate and regular rhythm.   2/6 systolic murmur.  Pulmonary/Chest: Effort normal and breath sounds normal.  Abdominal: Soft.  There is no tenderness. There is no rebound and no guarding.  Midline scar well healed.  Musculoskeletal: Normal range of motion.  Lymphadenopathy:    She has no cervical adenopathy.  Neurological: She is alert and oriented to person,  place, and time.  Skin: Skin is warm and dry. No rash noted.  Psychiatric: She has a normal mood and affect.  Vitals reviewed.  Assessment & Plan:   Problem List Items Addressed This Visit    Type 2 diabetes mellitus without complication, without long-term current use of insulin ([place]) - Primary    New problem (to me). Sugars improving following hospitalization. Continuing metformin and Tradjenta. Microalbumin today. Referring to ophthalmology for an eye exam. Follow-up in October.      Relevant Medications   lisinopril (PRINIVIL,ZESTRIL) 10 MG tablet   linagliptin (TRADJENTA) 5 MG TABS tablet   Other Relevant Orders   Comp Met (CMET)   Lipid Profile   Urine Microalbumin w/creat. ratio   Essential hypertension    New problem. Starting patient on Lisinopril. Labs in 7-10 days.      Relevant Medications   lisinopril (PRINIVIL,ZESTRIL) 10 MG tablet   Xanthelasma    New problem. Lipid panel today.      Pulmonary embolus (HCC)    Stable on Eliquis. Continue. Has f/u with heme/onc.      Relevant Medications   lisinopril (PRINIVIL,ZESTRIL) 10 MG tablet    Other Visit Diagnoses    Screening for breast cancer       Relevant Orders   MM DIGITAL SCREENING BILATERAL   Elevated WBC count       Relevant Orders   CBC      Outpatient Encounter Prescriptions as of 05/03/2016  Medication Sig  . metFORMIN (GLUCOPHAGE-XR) 500 MG 24 hr tablet Take 500 mg by mouth 2 (two) times daily. 2 tablets twice daily.  Marland Kitchen apixaban (ELIQUIS) 5 MG TABS tablet Take 5 mg by mouth 2 (two) times daily.   Marland Kitchen linagliptin (TRADJENTA) 5 MG TABS tablet Take 1 tablet (5 mg total) by mouth daily.  Marland Kitchen lisinopril (PRINIVIL,ZESTRIL) 10 MG tablet Take 1 tablet (10 mg total) by mouth daily.  . [DISCONTINUED] insulin glargine (LANTUS) 100 UNIT/ML injection Inject 40 Units into the skin at bedtime.  . [DISCONTINUED] linagliptin (TRADJENTA) 5 MG TABS tablet Take by mouth.   No facility-administered encounter  medications on file as of 05/03/2016.     Follow-up: 1 month.  [place]

## 2016-05-03 NOTE — Patient Instructions (Signed)
Continue your current medications.  I have added Lisinopril.  You will need labs in 7-10 days.  We will arrange for you to see an eye doctor.  Follow up in 1 month.  Take care  Dr. Lacinda Axon

## 2016-05-03 NOTE — Assessment & Plan Note (Signed)
Stable on Eliquis. Continue. Has f/u with heme/onc.

## 2016-05-03 NOTE — Assessment & Plan Note (Signed)
New problem. Lipid panel today.

## 2016-05-03 NOTE — Progress Notes (Signed)
Pre visit review using our clinic review tool, if applicable. No additional management support is needed unless otherwise documented below in the visit note. 

## 2016-05-03 NOTE — Assessment & Plan Note (Addendum)
New problem (to me). Sugars improving following hospitalization. Continuing metformin and Tradjenta. Microalbumin today. Referring to ophthalmology for an eye exam. Follow-up in October.

## 2016-05-03 NOTE — Assessment & Plan Note (Signed)
New problem. Starting patient on Lisinopril. Labs in 7-10 days.

## 2016-05-04 ENCOUNTER — Telehealth: Payer: Self-pay | Admitting: *Deleted

## 2016-05-04 ENCOUNTER — Other Ambulatory Visit: Payer: Self-pay | Admitting: Family Medicine

## 2016-05-04 MED ORDER — ATORVASTATIN CALCIUM 40 MG PO TABS: 40.0000 mg | ORAL_TABLET | Freq: Every day | ORAL | 3 refills | Status: DC

## 2016-05-04 NOTE — Telephone Encounter (Signed)
Pt was given results.  

## 2016-05-04 NOTE — Telephone Encounter (Signed)
Patient requested lab results Pt contact [phone]

## 2016-05-09 ENCOUNTER — Telehealth: Payer: Self-pay | Admitting: *Deleted

## 2016-05-09 ENCOUNTER — Other Ambulatory Visit: Payer: Self-pay | Admitting: Family Medicine

## 2016-05-09 DIAGNOSIS — I10 Essential (primary) hypertension: Secondary | ICD-10-CM

## 2016-05-09 NOTE — Telephone Encounter (Signed)
Pt coming in for labs tomorrow. No orders found.

## 2016-05-10 ENCOUNTER — Other Ambulatory Visit (INDEPENDENT_AMBULATORY_CARE_PROVIDER_SITE_OTHER): Payer: 59

## 2016-05-10 DIAGNOSIS — I10 Essential (primary) hypertension: Secondary | ICD-10-CM | POA: Diagnosis not present

## 2016-05-10 LAB — BASIC METABOLIC PANEL
BUN: 16 mg/dL (ref 6–23)
CALCIUM: 9.1 mg/dL (ref 8.4–10.5)
CHLORIDE: 106 meq/L (ref 96–112)
CO2: 27 mEq/L (ref 19–32)
CREATININE: 0.75 mg/dL (ref 0.40–1.20)
GFR: 85.72 mL/min (ref >60.00)
Glucose, Bld: 123 mg/dL — ABNORMAL HIGH (ref 70–99)
Potassium: 4.3 mEq/L (ref 3.5–5.1)
Sodium: 139 mEq/L (ref 135–145)

## 2016-05-24 ENCOUNTER — Ambulatory Visit
Admission: RE | Admit: 2016-05-24 | Discharge: 2016-05-24 | Disposition: A | Discharge status: Home / Self Care | Payer: 59 | Source: Ambulatory Visit | Attending: Family Medicine | Admitting: Family Medicine

## 2016-05-24 DIAGNOSIS — R928 Other abnormal and inconclusive findings on diagnostic imaging of breast: Secondary | ICD-10-CM | POA: Diagnosis not present

## 2016-05-24 DIAGNOSIS — R921 Mammographic calcification found on diagnostic imaging of breast: Secondary | ICD-10-CM | POA: Diagnosis not present

## 2016-05-24 DIAGNOSIS — Z1231 Encounter for screening mammogram for malignant neoplasm of breast: Secondary | ICD-10-CM | POA: Insufficient documentation

## 2016-05-24 DIAGNOSIS — Z1239 Encounter for other screening for malignant neoplasm of breast: Secondary | ICD-10-CM

## 2016-05-25 ENCOUNTER — Telehealth: Payer: Self-pay | Admitting: *Deleted

## 2016-05-25 ENCOUNTER — Other Ambulatory Visit: Payer: Self-pay | Admitting: Family Medicine

## 2016-05-25 DIAGNOSIS — N6310 Unspecified lump in the right breast, unspecified quadrant: Secondary | ICD-10-CM

## 2016-05-25 DIAGNOSIS — R921 Mammographic calcification found on diagnostic imaging of breast: Secondary | ICD-10-CM

## 2016-05-25 NOTE — Telephone Encounter (Signed)
Pt requested mammogram results if available  Pt contact [phone]

## 2016-05-25 NOTE — Telephone Encounter (Signed)
Called patient and went over mammo instructions per Dr. Lacinda Axon. Patient verbalized understanding.

## 2016-05-31 LAB — HM DIABETES EYE EXAM

## 2016-06-07 ENCOUNTER — Encounter: Payer: Self-pay | Admitting: Family Medicine

## 2016-06-07 ENCOUNTER — Ambulatory Visit (INDEPENDENT_AMBULATORY_CARE_PROVIDER_SITE_OTHER): Payer: 59 | Admitting: Family Medicine

## 2016-06-07 VITALS — BP 128/76 | HR 83 | Temp 98.0°F | Wt 219.5 lb

## 2016-06-07 DIAGNOSIS — I10 Essential (primary) hypertension: Secondary | ICD-10-CM

## 2016-06-07 DIAGNOSIS — E785 Hyperlipidemia, unspecified: Secondary | ICD-10-CM | POA: Diagnosis not present

## 2016-06-07 DIAGNOSIS — E119 Type 2 diabetes mellitus without complications: Secondary | ICD-10-CM | POA: Diagnosis not present

## 2016-06-07 LAB — HEMOGLOBIN A1C: Hgb A1c MFr Bld: 5.9 % (ref 4.6–6.5)

## 2016-06-07 NOTE — Assessment & Plan Note (Signed)
Uncontrolled. Advised to take Lipitor.

## 2016-06-07 NOTE — Assessment & Plan Note (Signed)
Well controlled. Continue ACEI.

## 2016-06-07 NOTE — Patient Instructions (Signed)
Continue your meds.  Start the lipitor.  Follow up in 3 months.  Take care  Dr. Lacinda Axon

## 2016-06-07 NOTE — Progress Notes (Signed)
   Subjective:  Patient ID: Kimberly Rowe, female    DOB: [birth year]  Age: 56 y.o. MRN: [id number]  CC: Follow up  HPI:  55 year old female with DM 2, hypertension, hyperlipidemia, PE (currently on treatment) presents for follow up.  DM  Blood sugars readings - Markedly improved. Fastings 120-130's.   Hypoglycemia - No.  Medications - Metformin, Tradjenta.   Adverse effects - No.  Compliance - Yes.   HLD  Not at goal.  Not taking statin.  Will discuss today.  HTN  Well controlled on Lisinopril.  Social Hx   Social History   Social History  . Marital status: Married    Spouse name: N/A  . Number of children: N/A  . Years of education: N/A   Social History Main Topics  . Smoking status: Never Smoker  . Smokeless tobacco: Never Used  . Alcohol use Yes     Comment: occ  . Drug use: No  . Sexual activity: Yes    Partners: Male   Other Topics Concern  . None   Social History Narrative  . None    Review of Systems  Constitutional: Negative.   Musculoskeletal: Positive for neck pain.   Objective:  BP 128/76 (BP Location: Right Arm, Patient Position: Sitting, Cuff Size: Normal)   Pulse 83   Temp 98 F (36.7 C) (Oral)   Wt 219 lb 8 oz (99.6 kg)   LMP 01/19/2016 (Exact Date)   SpO2 97%   BMI 32.41 kg/m   BP/Weight 06/07/2016 05/03/2016 [id number]  Systolic BP 0000000 [id number] 0000000  Diastolic BP 76 89 97  Wt. (Lbs) 219.5 219.25 217  BMI 32.41 32.38 32.05   Physical Exam  Constitutional: She is oriented to person, place, and time. She appears well-developed. No distress.  Cardiovascular: Normal rate and regular rhythm.   2/6 systolic murmur.  Pulmonary/Chest: Effort normal and breath sounds normal.  Neurological: She is alert and oriented to person, place, and time.  Psychiatric: She has a normal mood and affect.  Vitals reviewed.  Lab Results  Component Value Date   WBC 5.2 05/03/2016   HGB 12.9 05/03/2016   HCT 37.9 05/03/2016   PLT 237.0  05/03/2016   GLUCOSE 123 (H) 05/10/2016   CHOL 189 05/03/2016   TRIG 209.0 (H) 05/03/2016   HDL 42.00 05/03/2016   LDLDIRECT 109.0 05/03/2016   ALT 30 05/03/2016   AST 20 05/03/2016   NA 139 05/10/2016   K 4.3 05/10/2016   CL 106 05/10/2016   CREATININE 0.75 05/10/2016   BUN 16 05/10/2016   CO2 27 05/10/2016   INR 1.08 03/02/2016   MICROALBUR <0.7 05/03/2016    Assessment & Plan:   Problem List Items Addressed This Visit    Type 2 diabetes mellitus without complication, without long-term current use of insulin ([place]) - Primary    Improving. A1c today. Continuing metformin and Tradjenta.      Relevant Orders   Hemoglobin A1C   Hyperlipidemia    Uncontrolled. Advised to take Lipitor.      Essential hypertension    Well controlled. Continue ACEI.       Other Visit Diagnoses   None.    Follow-up: Return in about 3 months (around 09/07/2016).  [place]

## 2016-06-07 NOTE — Assessment & Plan Note (Signed)
Improving. A1c today. Continuing metformin and Tradjenta.

## 2016-06-12 ENCOUNTER — Encounter: Payer: Self-pay | Admitting: Family Medicine

## 2016-06-16 ENCOUNTER — Ambulatory Visit
Admission: RE | Admit: 2016-06-16 | Discharge: 2016-06-16 | Disposition: A | Discharge status: Home / Self Care | Payer: 59 | Source: Ambulatory Visit | Attending: Family Medicine | Admitting: Family Medicine

## 2016-06-16 DIAGNOSIS — R921 Mammographic calcification found on diagnostic imaging of breast: Secondary | ICD-10-CM | POA: Diagnosis present

## 2016-06-16 DIAGNOSIS — N6310 Unspecified lump in the right breast, unspecified quadrant: Secondary | ICD-10-CM | POA: Diagnosis not present

## 2016-06-19 ENCOUNTER — Other Ambulatory Visit: Payer: Self-pay | Admitting: Family Medicine

## 2016-06-19 ENCOUNTER — Telehealth: Payer: Self-pay | Admitting: *Deleted

## 2016-06-19 DIAGNOSIS — R921 Mammographic calcification found on diagnostic imaging of breast: Secondary | ICD-10-CM

## 2016-06-19 NOTE — Telephone Encounter (Signed)
Pt  requested to know if she would need to schedule her biopsy, she stated that he received a call this morning from this office. Pt contact [phone]

## 2016-06-19 NOTE — Telephone Encounter (Signed)
Pt was advised that she will need to call mammogram locaiton to get her needle biopsy scheduled. Pt gave verbal understanding.

## 2016-07-04 ENCOUNTER — Ambulatory Visit
Admission: RE | Admit: 2016-07-04 | Discharge: 2016-07-04 | Disposition: A | Discharge status: Home / Self Care | Payer: 59 | Source: Ambulatory Visit | Attending: Family Medicine | Admitting: Family Medicine

## 2016-07-04 DIAGNOSIS — R921 Mammographic calcification found on diagnostic imaging of breast: Secondary | ICD-10-CM

## 2016-07-04 HISTORY — PX: BREAST BIOPSY: SHX20

## 2016-07-05 LAB — SURGICAL PATHOLOGY

## 2016-10-03 ENCOUNTER — Other Ambulatory Visit: Payer: Self-pay | Admitting: *Deleted

## 2016-10-03 NOTE — Telephone Encounter (Signed)
Needs to discuss with hematology.

## 2016-10-03 NOTE — Telephone Encounter (Signed)
Historical medication. Pt last seen 06/07/16. Please advise?

## 2016-10-03 NOTE — Telephone Encounter (Signed)
Pt requested a medication refill for eliquis  Pharmacy [facility]

## 2016-10-05 NOTE — Telephone Encounter (Signed)
LVTCB

## 2016-10-05 NOTE — Telephone Encounter (Signed)
Husband called back and was told that medication would have to come from Hematology. Husband stated that he was unable to get in touch with the provider. He was given a number to call. He gave verbal understanding.

## 2017-01-03 ENCOUNTER — Telehealth: Payer: Self-pay | Admitting: *Deleted

## 2017-01-03 MED ORDER — METFORMIN HCL ER 500 MG PO TB24: 500.0000 mg | ORAL_TABLET | Freq: Two times a day (BID) | ORAL | 0 refills | Status: DC

## 2017-01-03 NOTE — Telephone Encounter (Signed)
Medication Refill requested for :metformin  Pharmacy:[facility] [address] Return Contact : [phone]

## 2017-01-03 NOTE — Telephone Encounter (Signed)
Refill was sent to pharmacy.

## 2017-01-04 ENCOUNTER — Telehealth: Payer: Self-pay | Admitting: Family Medicine

## 2017-01-04 MED ORDER — METFORMIN HCL ER 500 MG PO TB24: ORAL_TABLET | ORAL | 0 refills | Status: DC

## 2017-01-04 NOTE — Telephone Encounter (Signed)
Pt's husband called and stated that she was diagnosed with diabetes when she had her surgery at [facility]. He said that they will no longer fill her metformin since she now has a pcp. He would like a rx of the metformin [facility] mg [facility] [place] [facility] Pt cb [phone]

## 2017-01-04 NOTE — Telephone Encounter (Signed)
LVM stating we are unable to see labs from [place] pt will need to get labs sent to our office. I am unable to see in care everywhere.

## 2017-01-10 ENCOUNTER — Ambulatory Visit (INDEPENDENT_AMBULATORY_CARE_PROVIDER_SITE_OTHER): Payer: 59 | Admitting: Family Medicine

## 2017-01-10 DIAGNOSIS — E785 Hyperlipidemia, unspecified: Secondary | ICD-10-CM

## 2017-01-10 DIAGNOSIS — I10 Essential (primary) hypertension: Secondary | ICD-10-CM

## 2017-01-10 DIAGNOSIS — E119 Type 2 diabetes mellitus without complications: Secondary | ICD-10-CM

## 2017-01-10 LAB — POCT GLYCOSYLATED HEMOGLOBIN (HGB A1C): Hemoglobin A1C: 6.5

## 2017-01-10 NOTE — Patient Instructions (Signed)
Continue your medications.  Follow up in 6 months.  Take care  Dr. Kela Baccari  

## 2017-01-11 ENCOUNTER — Encounter: Payer: Self-pay | Admitting: Family Medicine

## 2017-01-11 DIAGNOSIS — E669 Obesity, unspecified: Secondary | ICD-10-CM | POA: Insufficient documentation

## 2017-01-11 NOTE — Assessment & Plan Note (Signed)
At goal. Continue lisinopril.  

## 2017-01-11 NOTE — Progress Notes (Signed)
Subjective:  Patient ID: Kimberly Rowe, female    DOB: 13-Apr-1961  Age: 56 y.o. MRN: [id number]  CC: Follow up  HPI:  56 year old female obesity, hypertension, HLD, DM 2 presents for follow-up.  DM 2  Has been well controlled.  Needs A1c today.  Currently compliant with metformin and Tradjenta.  Hypertension  Well controlled on lisinopril.  Hyperlipidemia  Last LDL was 109.  She is compliant with Lipitor.  Social Hx   Social History   Social History  . Marital status: Married    Spouse name: N/A  . Number of children: N/A  . Years of education: N/A   Social History Main Topics  . Smoking status: Never Smoker  . Smokeless tobacco: Never Used  . Alcohol use Yes     Comment: occ  . Drug use: No  . Sexual activity: Yes    Partners: Male   Other Topics Concern  . Not on file   Social History Narrative  . No narrative on file    Review of Systems  Constitutional: Negative.   Respiratory: Negative.   Cardiovascular: Negative.    Objective:  BP 122/76 (BP Location: Left Arm, Patient Position: Sitting, Cuff Size: Large)   Pulse 75   Temp 98.2 F (36.8 C) (Oral)   Wt 234 lb 2 oz (106.2 kg)   LMP 01/19/2016 (Exact Date)   SpO2 98%   BMI 34.57 kg/m   BP/Weight 01/10/2017 06/07/2016 9/41/7408  Systolic BP 144 818 563  Diastolic BP 76 76 89  Wt. (Lbs) 234.13 219.5 219.25  BMI 34.57 32.41 32.38    Physical Exam  Constitutional: She is oriented to person, place, and time. She appears well-developed. No distress.  Cardiovascular: Normal rate and regular rhythm.   Pulmonary/Chest: Effort normal and breath sounds normal.  Neurological: She is alert and oriented to person, place, and time.  Psychiatric: She has a normal mood and affect.  Vitals reviewed.   Lab Results  Component Value Date   WBC 5.2 05/03/2016   HGB 12.9 05/03/2016   HCT 37.9 05/03/2016   PLT 237.0 05/03/2016   GLUCOSE 123 (H) 05/10/2016   CHOL 189 05/03/2016   TRIG 209.0 (H)  05/03/2016   HDL 42.00 05/03/2016   LDLDIRECT 109.0 05/03/2016   ALT 30 05/03/2016   AST 20 05/03/2016   NA 139 05/10/2016   K 4.3 05/10/2016   CL 106 05/10/2016   CREATININE 0.75 05/10/2016   BUN 16 05/10/2016   CO2 27 05/10/2016   INR 1.08 03/02/2016   HGBA1C 6.5 01/10/2017   MICROALBUR <0.7 05/03/2016    Assessment & Plan:   Problem List Items Addressed This Visit    Type 2 diabetes mellitus without complication, without long-term current use of insulin ([place])    At goal. Continue metformin and tradjenta.      Relevant Medications   atorvastatin (LIPITOR) 40 MG tablet   Other Relevant Orders   POCT glycosylated hemoglobin (Hb A1C) (Completed)   Hyperlipidemia    Continue Lipitor. Lipid panel at follow-up.      Relevant Medications   atorvastatin (LIPITOR) 40 MG tablet   Essential hypertension    At goal. Continue lisinopril.       Relevant Medications   atorvastatin (LIPITOR) 40 MG tablet      Meds ordered this encounter  Medications  . atorvastatin (LIPITOR) 40 MG tablet    Sig: Take by mouth.   Follow-up: 6 months  Thersa Salt DO [facility]  

## 2017-01-11 NOTE — Assessment & Plan Note (Signed)
At goal. Continue metformin and tradjenta.

## 2017-01-11 NOTE — Assessment & Plan Note (Signed)
Continue Lipitor. Lipid panel at follow-up.

## 2017-01-12 ENCOUNTER — Other Ambulatory Visit: Payer: Self-pay | Admitting: Family Medicine

## 2017-02-06 ENCOUNTER — Other Ambulatory Visit: Payer: Self-pay | Admitting: Family Medicine

## 2017-03-01 ENCOUNTER — Other Ambulatory Visit: Payer: Self-pay | Admitting: *Deleted

## 2017-03-01 NOTE — Telephone Encounter (Signed)
Patient will need a Prior Authorization for linagliptin Pt contact [phone]

## 2017-03-05 NOTE — Telephone Encounter (Addendum)
Per Pharmacy sent to MD who previously prescribed  Dr Kathlen Mody.  Advised patient of this.

## 2017-03-13 NOTE — Telephone Encounter (Signed)
Spoke with Legrand Como I advised working on prior authorization for [employer]. Awaiting prior authorization information from pharmacy in order to start prior authorization process.

## 2017-03-13 NOTE — Telephone Encounter (Addendum)
Pt spouse called and was looking for an update on the PA. Pt spouse states that Dr. Lacinda Axon is her new pcp and that everything had to be transferred over not sure why they would have sent it to another doctor. Pt is just about out of medication. Please advise, thank you!  Call pt @ [phone]

## 2017-03-13 NOTE — Telephone Encounter (Signed)
Pharmacy will be faxing over prior authorization  Form.

## 2017-03-14 NOTE — Telephone Encounter (Addendum)
Last office visit 01/10/17 To follow up 6 months , no office visit scheduled  Can I refill

## 2017-03-14 NOTE — Telephone Encounter (Signed)
Pt stated that she talked to the [facility] and that they just need a new Rx for linagliptin (TRADJENTA) 5 MG TABS tablet. she stated that the reason that [place] is requiring a PA is because it is coming from two different doctors. Please advise. She  just need a hand written rx and she is out of this rx

## 2017-03-14 NOTE — Addendum Note (Signed)
Addended by: Johna Sheriff on: 03/14/2017 11:37 AM   Modules accepted: Orders

## 2017-03-15 MED ORDER — LINAGLIPTIN 5 MG PO TABS: 5.0000 mg | ORAL_TABLET | Freq: Every day | ORAL | 3 refills | Status: DC

## 2017-03-17 ENCOUNTER — Other Ambulatory Visit: Payer: Self-pay | Admitting: Family Medicine

## 2017-04-10 ENCOUNTER — Other Ambulatory Visit: Payer: Self-pay | Admitting: Family Medicine

## 2017-05-19 ENCOUNTER — Other Ambulatory Visit: Payer: Self-pay | Admitting: Family Medicine

## 2017-06-20 ENCOUNTER — Other Ambulatory Visit: Payer: Self-pay | Admitting: Family Medicine

## 2017-06-30 ENCOUNTER — Other Ambulatory Visit: Payer: Self-pay | Admitting: Family Medicine

## 2017-07-24 ENCOUNTER — Other Ambulatory Visit: Payer: Self-pay | Admitting: Family Medicine

## 2017-07-25 ENCOUNTER — Ambulatory Visit: Payer: BLUE CROSS/BLUE SHIELD | Admitting: Internal Medicine

## 2017-07-25 ENCOUNTER — Encounter: Payer: Self-pay | Admitting: Internal Medicine

## 2017-07-25 VITALS — BP 122/74 | HR 79 | Temp 98.3°F | Ht 69.0 in | Wt 251.4 lb

## 2017-07-25 DIAGNOSIS — E785 Hyperlipidemia, unspecified: Secondary | ICD-10-CM

## 2017-07-25 DIAGNOSIS — R011 Cardiac murmur, unspecified: Secondary | ICD-10-CM

## 2017-07-25 DIAGNOSIS — I10 Essential (primary) hypertension: Secondary | ICD-10-CM

## 2017-07-25 DIAGNOSIS — E119 Type 2 diabetes mellitus without complications: Secondary | ICD-10-CM | POA: Diagnosis not present

## 2017-07-25 DIAGNOSIS — H0260 Xanthelasma of unspecified eye, unspecified eyelid: Secondary | ICD-10-CM

## 2017-07-25 DIAGNOSIS — R928 Other abnormal and inconclusive findings on diagnostic imaging of breast: Secondary | ICD-10-CM | POA: Diagnosis not present

## 2017-07-25 DIAGNOSIS — E669 Obesity, unspecified: Secondary | ICD-10-CM

## 2017-07-25 DIAGNOSIS — Z1159 Encounter for screening for other viral diseases: Secondary | ICD-10-CM

## 2017-07-25 LAB — LIPID PANEL
CHOLESTEROL: 152 mg/dL (ref 0–200)
HDL: 53.3 mg/dL (ref >39.00)
LDL Cholesterol: 71 mg/dL (ref 0–99)
NONHDL: 98.95
Total CHOL/HDL Ratio: 3
Triglycerides: 139 mg/dL (ref 0.0–149.0)
VLDL: 27.8 mg/dL (ref 0.0–40.0)

## 2017-07-25 LAB — CBC
HEMATOCRIT: 43.2 % (ref 36.0–46.0)
HEMOGLOBIN: 14.4 g/dL (ref 12.0–15.0)
MCHC: 33.3 g/dL (ref 30.0–36.0)
MCV: 96 fl (ref 78.0–100.0)
PLATELETS: 176 10*3/uL (ref 150.0–400.0)
RBC: 4.5 Mil/uL (ref 3.87–5.11)
RDW: 13.2 % (ref 11.5–15.5)
WBC: 4.7 10*3/uL (ref 4.0–10.5)

## 2017-07-25 LAB — URINALYSIS, ROUTINE W REFLEX MICROSCOPIC
Bilirubin Urine: NEGATIVE
Hgb urine dipstick: NEGATIVE
Ketones, ur: NEGATIVE
LEUKOCYTES UA: NEGATIVE
Nitrite: NEGATIVE
RBC / HPF: NONE SEEN (ref 0–?)
SPECIFIC GRAVITY, URINE: 1.025 (ref 1.000–1.030)
TOTAL PROTEIN, URINE-UPE24: NEGATIVE
Urine Glucose: NEGATIVE
Urobilinogen, UA: 0.2 (ref 0.0–1.0)
WBC, UA: NONE SEEN (ref 0–?)
pH: 6 (ref 5.0–8.0)

## 2017-07-25 LAB — COMPREHENSIVE METABOLIC PANEL
ALT: 74 U/L — ABNORMAL HIGH (ref 0–35)
AST: 40 U/L — ABNORMAL HIGH (ref 0–37)
Albumin: 4 g/dL (ref 3.5–5.2)
Alkaline Phosphatase: 90 U/L (ref 39–117)
BUN: 16 mg/dL (ref 6–23)
CHLORIDE: 104 meq/L (ref 96–112)
CO2: 27 meq/L (ref 19–32)
Calcium: 9.2 mg/dL (ref 8.4–10.5)
Creatinine, Ser: 0.65 mg/dL (ref 0.40–1.20)
GFR: 100.66 mL/min (ref >60.00)
GLUCOSE: 164 mg/dL — AB (ref 70–99)
POTASSIUM: 4.1 meq/L (ref 3.5–5.1)
Sodium: 138 mEq/L (ref 135–145)
Total Bilirubin: 0.5 mg/dL (ref 0.2–1.2)
Total Protein: 7.1 g/dL (ref 6.0–8.3)

## 2017-07-25 LAB — MICROALBUMIN / CREATININE URINE RATIO
CREATININE, U: 84.9 mg/dL
MICROALB/CREAT RATIO: 0.8 mg/g (ref 0.0–30.0)
Microalb, Ur: <0.7 mg/dL (ref 0.0–1.9)

## 2017-07-25 LAB — T4, FREE: FREE T4: 0.74 ng/dL (ref 0.60–1.60)

## 2017-07-25 LAB — HEMOGLOBIN A1C: Hgb A1c MFr Bld: 7.6 % — ABNORMAL HIGH (ref 4.6–6.5)

## 2017-07-25 LAB — TSH: TSH: 0.41 u[IU]/mL (ref 0.35–4.50)

## 2017-07-25 LAB — GLUCOSE, POCT (MANUAL RESULT ENTRY): POC GLUCOSE: 178 mg/dL — AB (ref 70–99)

## 2017-07-25 MED ORDER — ATORVASTATIN CALCIUM 40 MG PO TABS: 40.0000 mg | ORAL_TABLET | Freq: Every day | ORAL | 1 refills | Status: DC

## 2017-07-25 MED ORDER — METFORMIN HCL ER 500 MG PO TB24: 500.0000 mg | ORAL_TABLET | Freq: Two times a day (BID) | ORAL | 1 refills | Status: DC

## 2017-07-25 MED ORDER — LISINOPRIL 10 MG PO TABS: 10.0000 mg | ORAL_TABLET | Freq: Every day | ORAL | 1 refills | Status: DC

## 2017-07-25 MED ORDER — LINAGLIPTIN 5 MG PO TABS: 5.0000 mg | ORAL_TABLET | Freq: Every day | ORAL | 1 refills | Status: DC

## 2017-07-25 NOTE — Patient Instructions (Signed)
Please consider Tdap and colonoscopy  Follow up in 6 months  We referred for bilateral diagnostic mammogram  Colonoscopy, Adult A colonoscopy is an exam to look at the entire large intestine. During the exam, a lubricated, bendable tube is inserted into the anus and then passed into the rectum, colon, and other parts of the large intestine. A colonoscopy is often done as a part of normal colorectal screening or in response to certain symptoms, such as anemia, persistent diarrhea, abdominal pain, and blood in the stool. The exam can help screen for and diagnose medical problems, including:  Tumors.  Polyps.  Inflammation.  Areas of bleeding.  Tell a health care provider about:  Any allergies you have.  All medicines you are taking, including vitamins, herbs, eye drops, creams, and over-the-counter medicines.  Any problems you or family members have had with anesthetic medicines.  Any blood disorders you have.  Any surgeries you have had.  Any medical conditions you have.  Any problems you have had passing stool. What are the risks? Generally, this is a safe procedure. However, problems may occur, including:  Bleeding.  A tear in the intestine.  A reaction to medicines given during the exam.  Infection (rare).  What happens before the procedure? Eating and drinking restrictions Follow instructions from your health care provider about eating and drinking, which may include:  A few days before the procedure - follow a low-fiber diet. Avoid nuts, seeds, dried fruit, raw fruits, and vegetables.  1-3 days before the procedure - follow a clear liquid diet. Drink only clear liquids, such as clear broth or bouillon, black coffee or tea, clear juice, clear soft drinks or sports drinks, gelatin dessert, and popsicles. Avoid any liquids that contain red or purple dye.  On the day of the procedure - do not eat or drink anything during the 2 hours before the procedure, or within  the time period that your health care provider recommends.  Bowel prep If you were prescribed an oral bowel prep to clean out your colon:  Take it as told by your health care provider. Starting the day before your procedure, you will need to drink a large amount of medicated liquid. The liquid will cause you to have multiple loose stools until your stool is almost clear or light green.  If your skin or anus gets irritated from diarrhea, you may use these to relieve the irritation: ? Medicated wipes, such as adult wet wipes with aloe and vitamin E. ? A skin soothing-product like petroleum jelly.  If you vomit while drinking the bowel prep, take a break for up to 60 minutes and then begin the bowel prep again. If vomiting continues and you cannot take the bowel prep without vomiting, call your health care provider.  General instructions  Ask your health care provider about changing or stopping your regular medicines. This is especially important if you are taking diabetes medicines or blood thinners.  Plan to have someone take you home from the hospital or clinic. What happens during the procedure?  An IV tube may be inserted into one of your veins.  You will be given medicine to help you relax (sedative).  To reduce your risk of infection: ? Your health care team will wash or sanitize their hands. ? Your anal area will be washed with soap.  You will be asked to lie on your side with your knees bent.  Your health care provider will lubricate a long, thin, flexible tube. The  tube will have a camera and a light on the end.  The tube will be inserted into your anus.  The tube will be gently eased through your rectum and colon.  Air will be delivered into your colon to keep it open. You may feel some pressure or cramping.  The camera will be used to take images during the procedure.  A small tissue sample may be removed from your body to be examined under a microscope (biopsy). If  any potential problems are found, the tissue will be sent to a lab for testing.  If small polyps are found, your health care provider may remove them and have them checked for cancer cells.  The tube that was inserted into your anus will be slowly removed. The procedure may vary among health care providers and hospitals. What happens after the procedure?  Your blood pressure, heart rate, breathing rate, and blood oxygen level will be monitored until the medicines you were given have worn off.  Do not drive for 24 hours after the exam.  You may have a small amount of blood in your stool.  You may pass gas and have mild abdominal cramping or bloating due to the air that was used to inflate your colon during the exam.  It is up to you to get the results of your procedure. Ask your health care provider, or the department performing the procedure, when your results will be ready. This information is not intended to replace advice given to you by your health care provider. Make sure you discuss any questions you have with your health care provider. Document Released: 08/04/2000 Document Revised: 06/07/2016 Document Reviewed: 10/19/2015 Elsevier Interactive Patient Education  2018 [employer].

## 2017-07-25 NOTE — Addendum Note (Signed)
Addended by: Johna Sheriff on: 07/25/2017 10:02 AM   Modules accepted: Orders

## 2017-07-25 NOTE — Progress Notes (Signed)
Chief Complaint  Patient presents with  . Follow-up   Pt presents for f/u doing well no complaints  1. DM 2 controlled last A1C 6.5 eye exam [facility] 3 or 11/2016  2. HTN controlled  3. Abnormal mammogram 06/16/16 left breast calcifications and ductal hyperplasia pathology negative with left breast clip 4. HLD reviewed 04/2016 uncontrolled   Review of Systems  Constitutional: Negative for malaise/fatigue.  HENT: Negative for hearing loss.   Eyes:       Denies vision problems  Respiratory: Negative for shortness of breath.   Cardiovascular: Negative for chest pain.  Gastrointestinal: Negative for abdominal pain and blood in stool.  Musculoskeletal: Negative for joint pain.  Skin: Positive for rash.  Neurological: Negative for headaches.  Psychiatric/Behavioral: Negative for depression.   Past Medical History:  Diagnosis Date  . Diabetes mellitus without complication ([place])   . Heart murmur   . Hypertension   . 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 ABDOMINAL HYSTERECTOMY  03/06/2016   Ex-lap, TAH, RSO, and drain placement on 03/06/16 for grossly infected benign ovarian mass.  Marland Kitchen BREAST BIOPSY Left 07/04/2016   Stereo- path unknown  . TONSILLECTOMY     Family History  Problem Relation Age of Onset  . Prostate cancer Father   . Arthritis Maternal Grandfather   . Breast cancer Neg Hx    Social History   Socioeconomic History  . Marital status: Married    Spouse name: Not on file  . Number of children: Not on file  . Years of education: Not on file  . Highest education level: Not on file  Social Needs  . Financial resource strain: Not on file  . Food insecurity - worry: Not on file  . Food insecurity - inability: Not on file  . Transportation needs -  medical: Not on file  . Transportation needs - non-medical: Not on file  Occupational History  . Not on file  Tobacco Use  . Smoking status: Never Smoker  . Smokeless tobacco: Never Used  Substance and Sexual Activity  . Alcohol use: Yes    Comment: occ  . Drug use: No  . Sexual activity: Yes    Partners: Male  Other Topics Concern  . Not on file  Social History Narrative  . Not on file   Current Meds  Medication Sig  . atorvastatin (LIPITOR) 40 MG tablet Take by mouth.  Marland Kitchen atorvastatin (LIPITOR) 40 MG tablet TAKE 1 TABLET BY MOUTH EVERY DAY  . lisinopril (PRINIVIL,ZESTRIL) 10 MG tablet TAKE 1 TABLET (10 MG TOTAL) BY MOUTH DAILY.  . metFORMIN (GLUCOPHAGE-XR) 500 MG 24 hr tablet TAKE 1 TABLET BY MOUTH TWICE A DAY *KEEP FOLLOW UP APPT*   No Known Allergies No results found for this or any previous visit (from the past 2160 hour(s)). Objective  Body mass index is 37.12 kg/m. Wt Readings from Last 3 Encounters:  07/25/17 251 lb 6 oz (114 kg)  01/10/17 234 lb 2 oz (106.2 kg)  06/07/16 219 lb 8 oz (99.6 kg)   Temp Readings from Last 3 Encounters:  07/25/17 98.3 F (36.8 C) (Oral)  01/10/17 98.2 F (36.8 C) (Oral)  06/07/16 98 F (36.7 C) (Oral)   BP Readings from Last 3 Encounters:  07/25/17 122/74  01/10/17 122/76  06/07/16 128/76   Pulse Readings from Last 3 Encounters:  07/25/17 79  01/10/17 75  06/07/16 83   Pulse oximetry on room air is 96% Physical Exam  Constitutional: She is oriented to person, place, and time and well-developed, well-nourished, and in no distress. Vital signs are normal.  HENT:  Head: Normocephalic and atraumatic.  Mouth/Throat: Oropharynx is clear and moist and mucous membranes are normal.  Eyes: Conjunctivae are normal. Pupils are equal, round, and reactive to light.  Cardiovascular: Normal rate and regular rhythm.  Murmur heard. Pulmonary/Chest: Effort normal and breath sounds normal.  Abdominal: Soft. Bowel sounds are normal. There  is no tenderness.  Neurological: She is alert and oriented to person, place, and time. Gait normal. Gait normal.  Skin: Skin is warm and dry.  Rosacea and seb derm to face and xanthelasma eyes  Psychiatric: Mood, memory, affect and judgment normal.  Nursing note and vitals reviewed.  Assessment   1. DM 2 cbg 178 fasting 2. HTN controlled/HLD uncontrolled  3. Abnormal mammogram (see HPI) 4. HM 5. Cardiac murmur   Plan  1.  Cont meds  Labs today CMET, CBC, UA, urine proteni, A1C, TSH, T4, lipid, hepb sAb and sAg, hep C declines HIV same partner 31 years   rec take statin qhs  disc'ed vaccines declines  Foot exam today nl DP/PT normal monofilament b/l, warm  Will get copy eye exam [facility] 3 or 11/2016  F/u in 6 months  2.  Controlled cont meds  3. Referred for b/l dx 3d mammo pt wants to do after Xmas 4.  Declines vaccines (given Tdap info) -declines flu and shingrix, disc pna 23 at f/u   Referred for mammo dx b/l 3d prev left clip and bx ductal hyperplasia neg atypica  No h/o abnormal pap s/p hysterectomy 02/2016 with absent cervix, uterus, right ovary, tube, left fallopian tube, pap neg 02/2016 neg HPV pathology + struma ovarii   Disc colonoscopy and cologuard pt will think about for now   Declines HIV testing.   Never smoker  5.  Echo had 02/2016 trivial valve regurg. Dilated IVC  Consider cards referral in future for f/u   Provider: Dr. Olivia Mackie McLean-Scocuzza

## 2017-07-25 NOTE — Addendum Note (Signed)
Addended by: Orland Mustard on: 07/25/2017 09:50 AM   Modules accepted: Orders

## 2017-07-26 LAB — HEPATITIS B SURFACE ANTIBODY, QUANTITATIVE

## 2017-07-26 LAB — HEPATITIS C ANTIBODY
Hepatitis C Ab: NONREACTIVE
SIGNAL TO CUT-OFF: 0.04 (ref <1.00)

## 2017-07-26 LAB — HEPATITIS B SURFACE ANTIGEN: Hepatitis B Surface Ag: NONREACTIVE

## 2017-08-02 ENCOUNTER — Telehealth: Payer: Self-pay | Admitting: Internal Medicine

## 2017-08-02 NOTE — Telephone Encounter (Signed)
Pt called to receive lab results Dr McLean-Scocuzza, "Hep B/C negative. rec hep B vaccine  No protein in urine  Urine normal  Liver enzymes slightly elevated  -can we schedule US abdomen to look at liver? Is she ok with this? This may be due to fatty liver but [place] can look at this Cholesterol normal great job  Thyroid labs normal  A1C 7.6 goal is <7.0 can she try healthy diet choices and exercise? Has she missed any doses? if not able to lower we will need to change diabetic meds I.e add more Blood cts normal"; pt states that she will talk to her husband and call back to let the office know if she would like to have ultrasound.

## 2017-08-06 ENCOUNTER — Telehealth: Payer: Self-pay | Admitting: Internal Medicine

## 2017-08-06 ENCOUNTER — Other Ambulatory Visit: Payer: Self-pay | Admitting: Internal Medicine

## 2017-08-06 DIAGNOSIS — R921 Mammographic calcification found on diagnostic imaging of breast: Secondary | ICD-10-CM

## 2017-08-06 DIAGNOSIS — N6310 Unspecified lump in the right breast, unspecified quadrant: Secondary | ICD-10-CM

## 2017-08-06 NOTE — Telephone Encounter (Signed)
Im not sure what you meant but reordered as 5535, [place] and 5532 thanks  [place]

## 2017-08-06 NOTE — Telephone Encounter (Signed)
Disregard last note.   Need new order to read IMG 3D5535 [place] IMG L 5391 and R 5532.Thank you!

## 2017-08-06 NOTE — Telephone Encounter (Signed)
Need new order to read [facility] IMG 5536 3D. Please and Thank you!

## 2017-08-29 NOTE — Telephone Encounter (Signed)
Nothing I was just informing you :)

## 2017-08-29 NOTE — Telephone Encounter (Signed)
Im not sure what you need me to do?

## 2017-08-29 NOTE — Telephone Encounter (Signed)
Pt will call to sch per pt.

## 2017-08-30 ENCOUNTER — Telehealth: Payer: Self-pay | Admitting: Internal Medicine

## 2017-08-30 NOTE — Telephone Encounter (Signed)
Copied from [place] [phone]. Topic: Quick Communication - See Telephone Encounter >> Aug 30, 2017 11:28 AM Ivar Drape wrote: CRM for notification. See Telephone encounter for:  08/30/17. Joellen Jersey from [facility] [phone] said a request for the patient's diabetic exam was sent but they haven't seen the patient since 10/17.  She said she would reach out to the patient to make an appt.

## 2017-08-30 NOTE — Telephone Encounter (Signed)
FYI

## 2018-02-27 IMAGING — CT CT ABD-PELV W/ CM
2 of 6 series · 15 of 46 positions shown, 17 images · IV contrast (iopamidol)
Comparison: None.

CLINICAL DATA: Right upper quadrant abdominal pain for 3 weeks.

EXAM:
CT ABDOMEN AND PELVIS WITH CONTRAST
TECHNIQUE: Multidetector CT imaging of the abdomen and pelvis was performed
using the standard protocol following bolus administration of
intravenous contrast.
CONTRAST:  100mL IWOFHN-8EE IOPAMIDOL (IWOFHN-8EE) INJECTION 61%

[Series 2: routine abd pel with · axial · 0.87mm/px · z∈[-346,+94]mm · 12 of 102 slices shown, 14 images]
[im 7/102  soft-tissue]
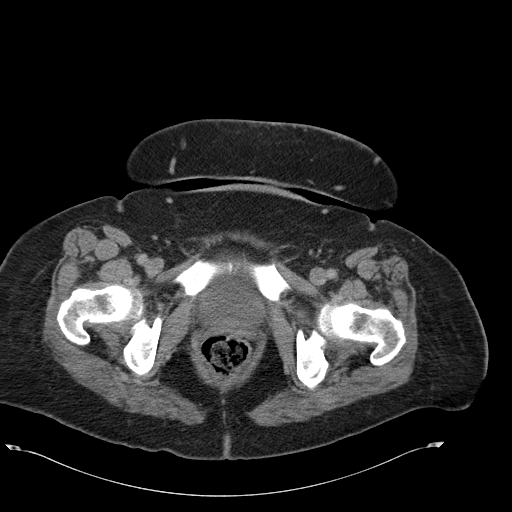
[im 7/102  bone]
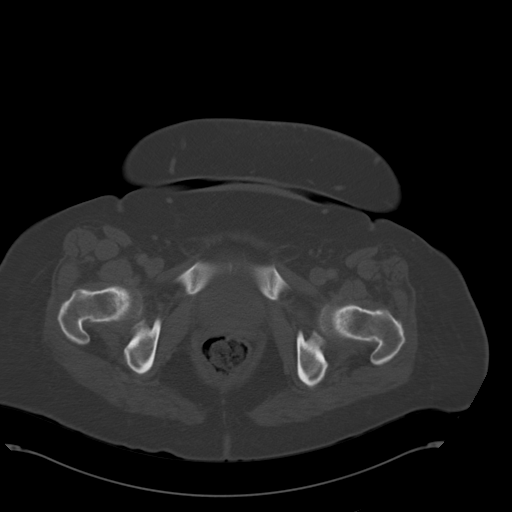
[im 13/102  soft-tissue]
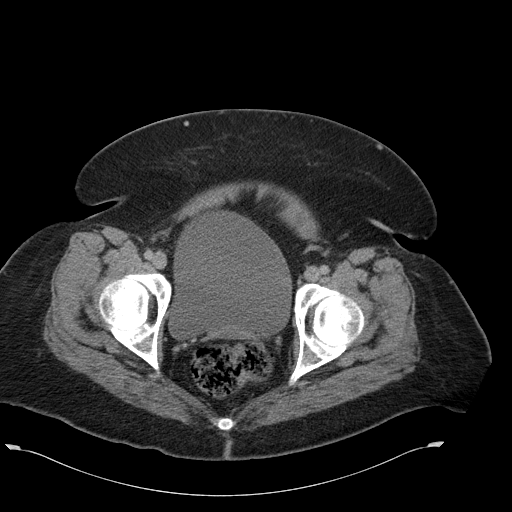
[im 26/102  soft-tissue]
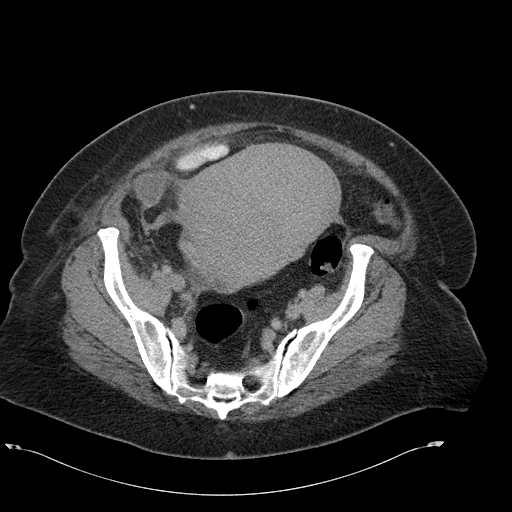
[im 32/102  soft-tissue]
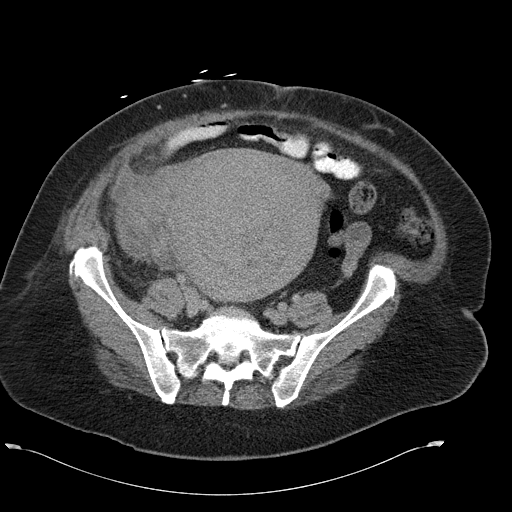
[im 38/102  soft-tissue]
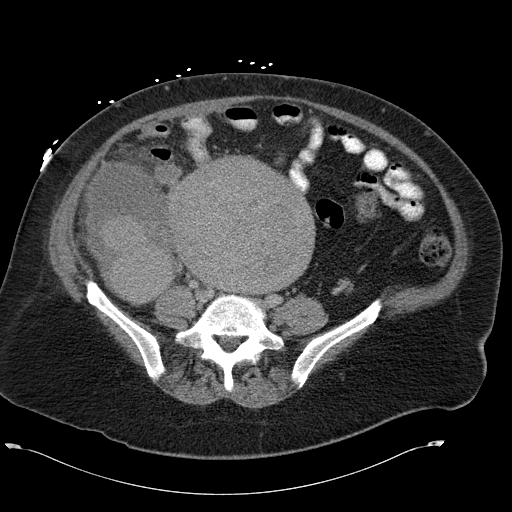
[im 45/102  soft-tissue]
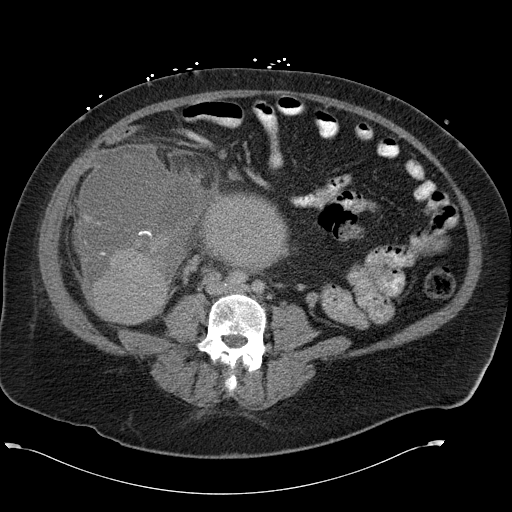
[im 57/102  soft-tissue]
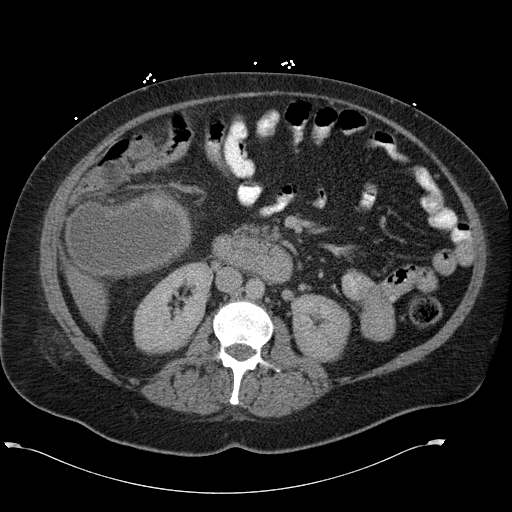
[im 64/102  soft-tissue]
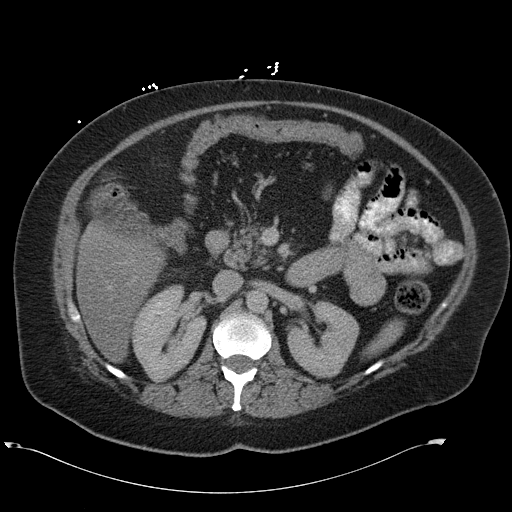
[im 70/102  soft-tissue]
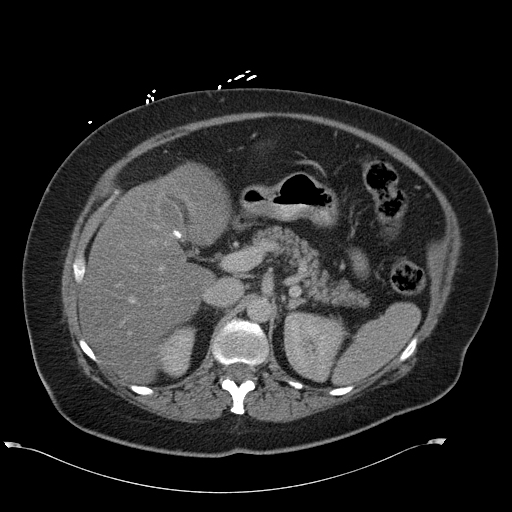
[im 70/102  bone]
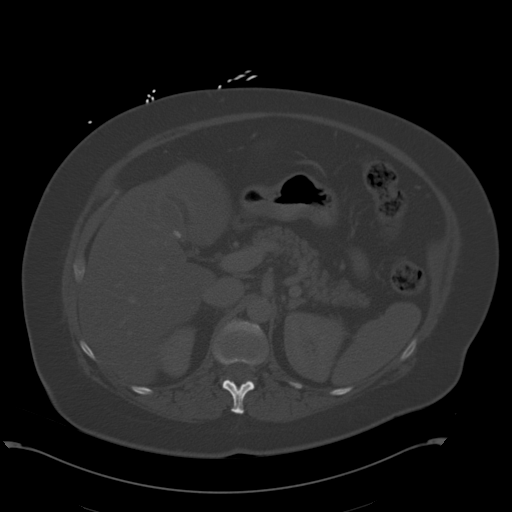
[im 76/102  soft-tissue]
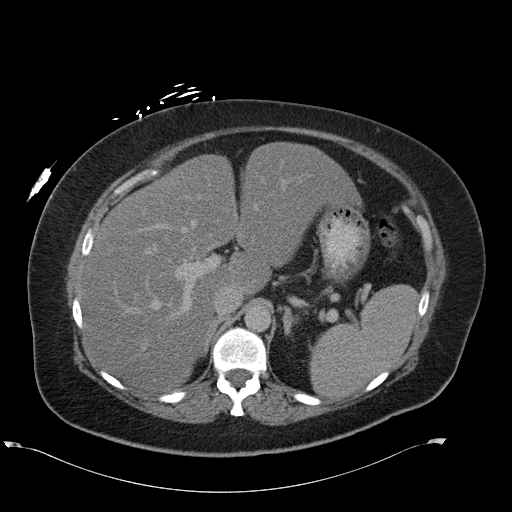
[im 89/102  soft-tissue]
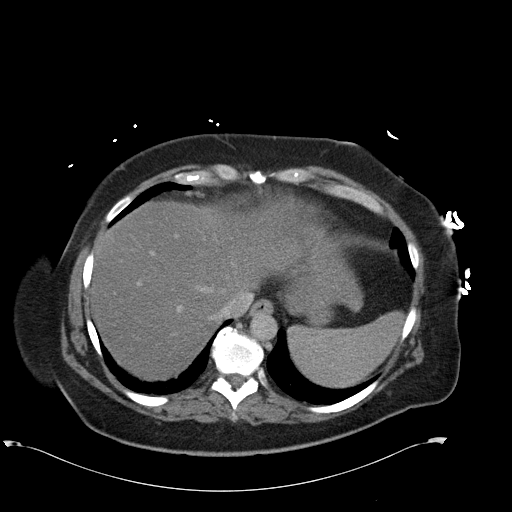
[im 95/102  soft-tissue]
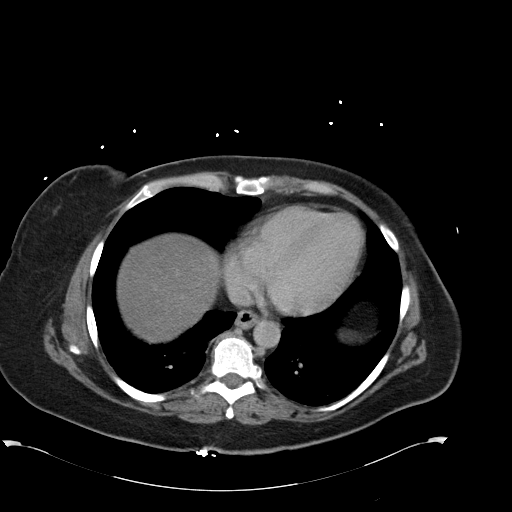

[Series 8: cor routine abd pel with · coronal · 0.85mm/px · 3 of 168 slices shown]
[im 56/168  soft-tissue]
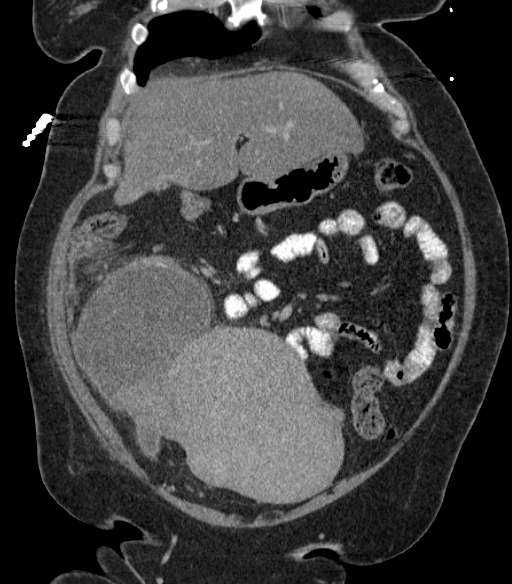
[im 75/168  soft-tissue]
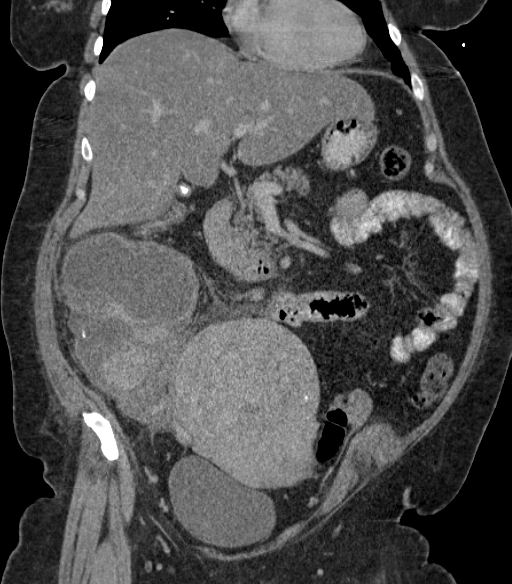
[im 93/168  soft-tissue]
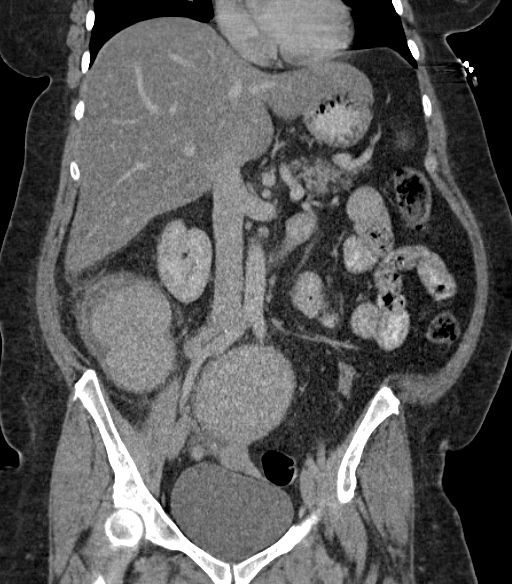

[15 of 46 positions shown; findings below may reference images not displayed]

FINDINGS: Lower chest:  Basilar atelectasis lower lungs.

Hepatobiliary: The liver shows diffusely decreased attenuation
suggesting steatosis. Calcified gallstones measure up to 8 mm. No
intrahepatic or extrahepatic biliary dilation.

Pancreas: No focal mass lesion. No dilatation of the main duct. No
intraparenchymal cyst. No peripancreatic edema.

Spleen: No splenomegaly. No focal mass lesion.

Adrenals/Urinary Tract: No adrenal nodule or mass. Kidneys are
unremarkable. No evidence for hydroureter. The urinary bladder
appears normal for the degree of distention.

Stomach/Bowel: Stomach is nondistended. No gastric wall thickening.
No evidence of outlet obstruction. Duodenum is normally positioned
as is the ligament of Treitz. No small bowel wall thickening. No
small bowel dilatation. Terminal ileum and ileocecal valve for
identified in the anterior right abdomen, immediately anterior to a
complex cystic and solid lesion in the right abdomen. The cecal tip
essentially becomes incorporated in the complex right abdominal
process. Colon is otherwise decompressed with some diverticular
changes on the left. Appendix cannot be visualized.

Vascular/Lymphatic: No abdominal aortic aneurysm. No abdominal
aortic atherosclerotic calcification. There is no gastrohepatic or
hepatoduodenal ligament lymphadenopathy. No intraperitoneal or
retroperitoneal lymphadenopathy. No pelvic sidewall lymphadenopathy.

Reproductive: Uterus appears markedly enlarged measuring 17.1 by
14.8 x 13.5 cm with an apparent large central fibroid measuring up
to 10 cm.

In the right abdomen, a complex cystic and solid mass measures 15 x
10 x 15 cm. This has solid components measuring up to 7.1 cm and
multiple loculated cystic components. Some of the loculated
components have rim calcification. The right gonadal vessels can be
tracked down into the inferior aspect of this lesion which appears
to have some degree of adherence to the cecal tip.

Other: No appreciable intraperitoneal free fluid.

Musculoskeletal: Bone windows reveal no worrisome lytic or sclerotic
osseous lesions.
IMPRESSION: 1. 15 cm complex cystic and solid lesion in the right abdomen has
multiple loculations of varying attenuation and some demonstrate rim
calcification. Gonadal vasculature on the right tracks into the
inferior portion of this lesion. As such, cystic ovarian neoplasm of
the right ovary is favored. However, the lesion appears to
incorporate the cecal tip and the appendix cannot be visualized.
Primary GI etiology such as perforated appendicitis or large
cecal/appendiceal neoplasm are considered possible, but less likely.
Given the rim calcification in some areas and soft tissue
components, features are not typical for tubo-ovarian abscess.
2. Markedly enlarged uterus secondary to fibroid change.

## 2018-03-17 IMAGING — US US ABDOMEN LIMITED
1 series · 14 of 25 positions shown · non-contrast
Comparison: None.

CLINICAL DATA: Right upper quadrant pain off and on for 3-4 weeks.

EXAM:
US ABDOMEN LIMITED - RIGHT UPPER QUADRANT

[Series 1: us abdomen limited · 0.25mm/px · 14 of 50 slices shown]
[im 1/50]
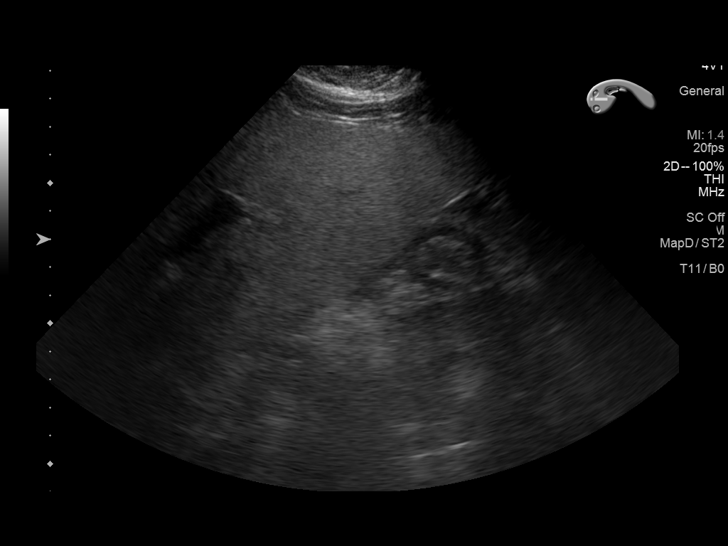
[im 5/50]
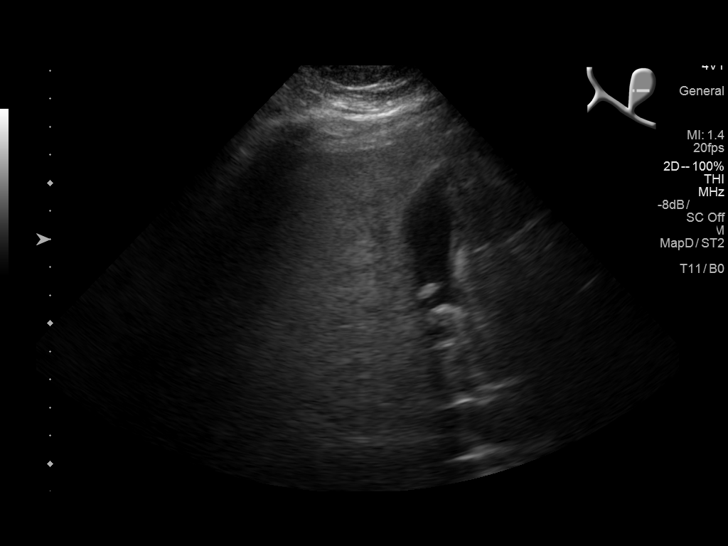
[im 9/50]
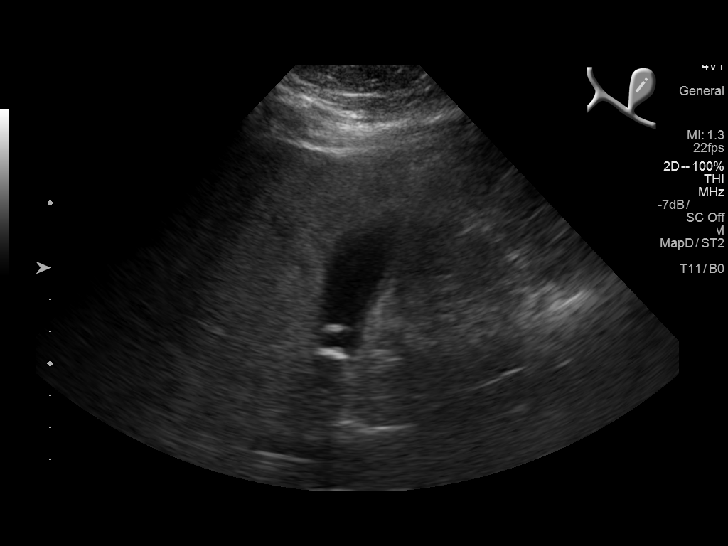
[im 13/50]
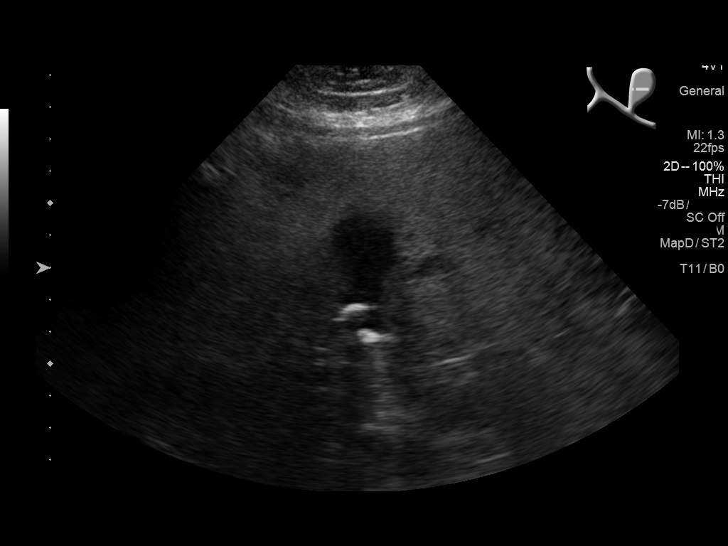
[im 17/50]
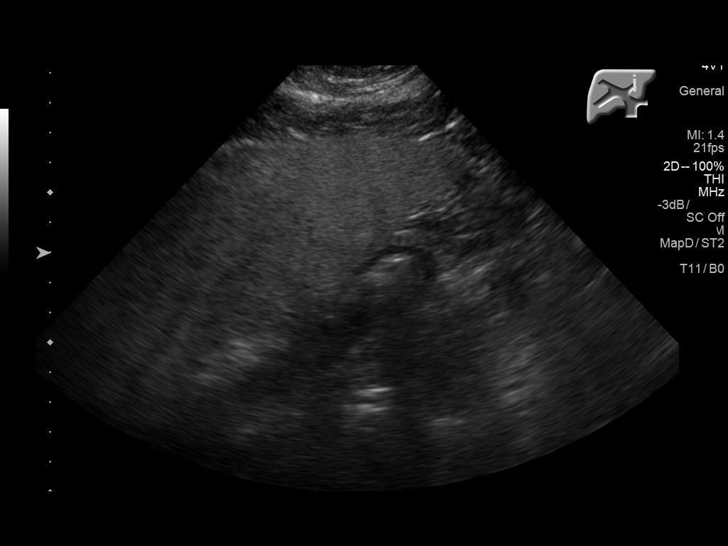
[im 19/50]
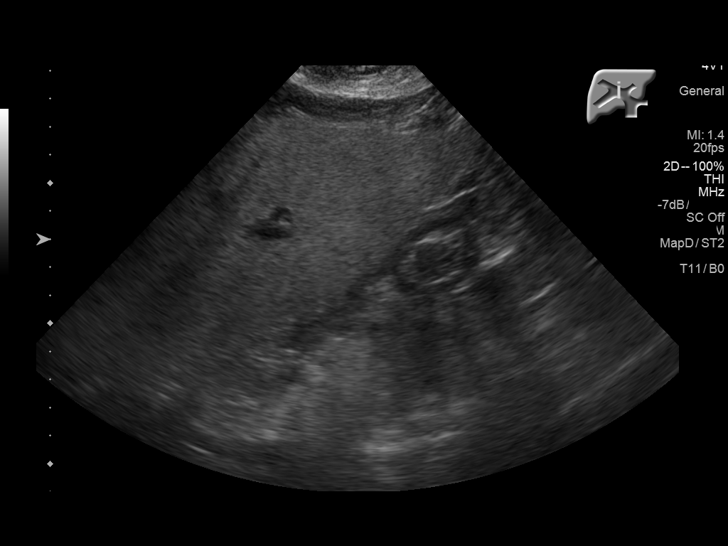
[im 23/50]
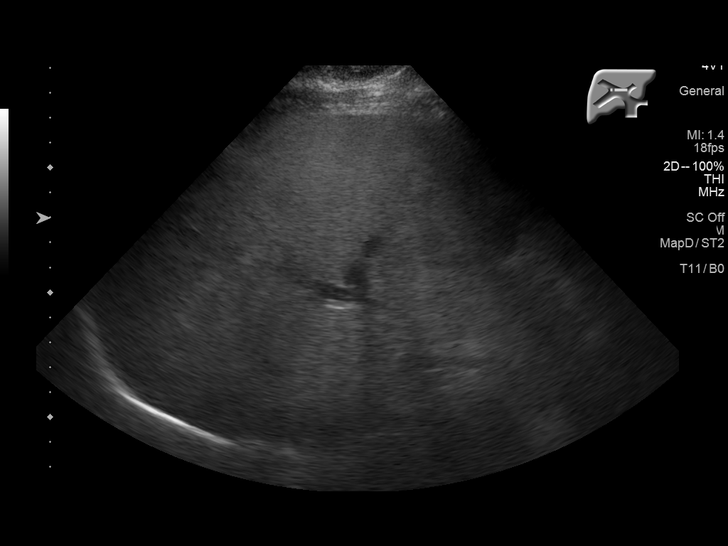
[im 27/50]
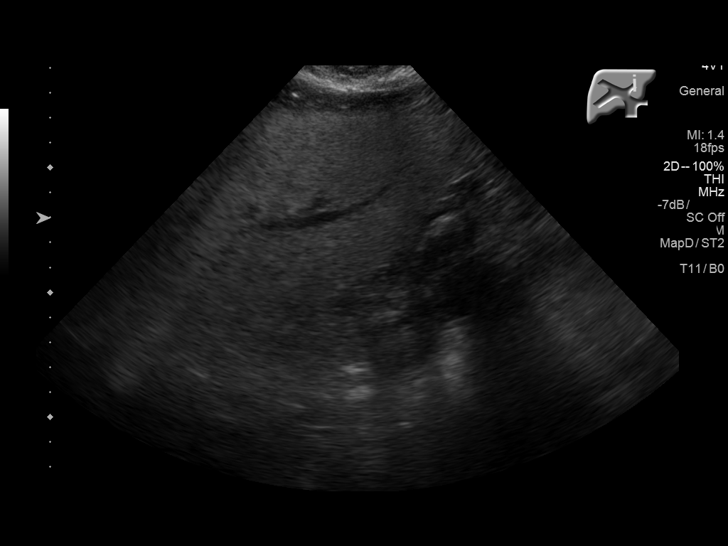
[im 31/50]
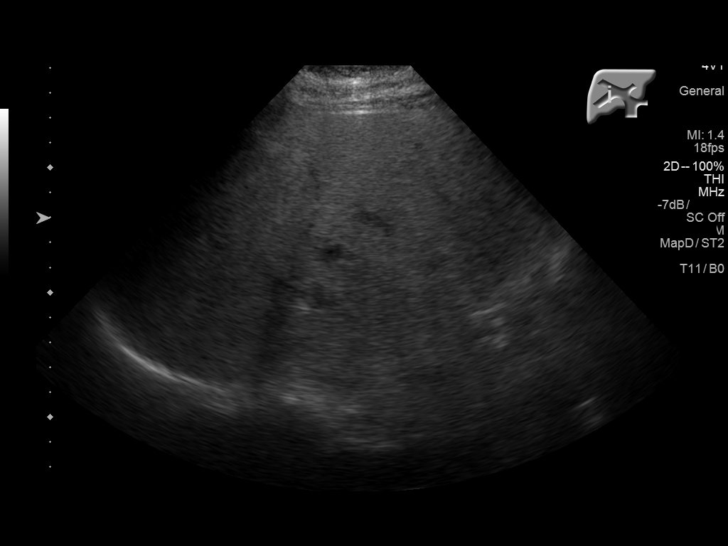
[im 33/50]
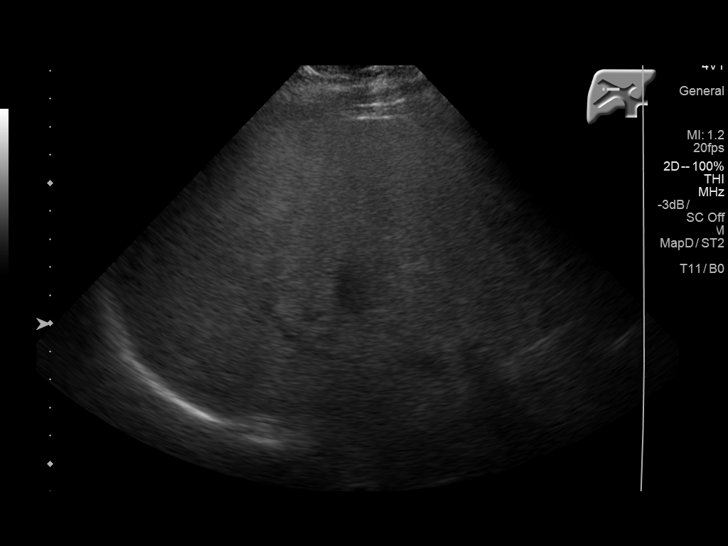
[im 37/50]
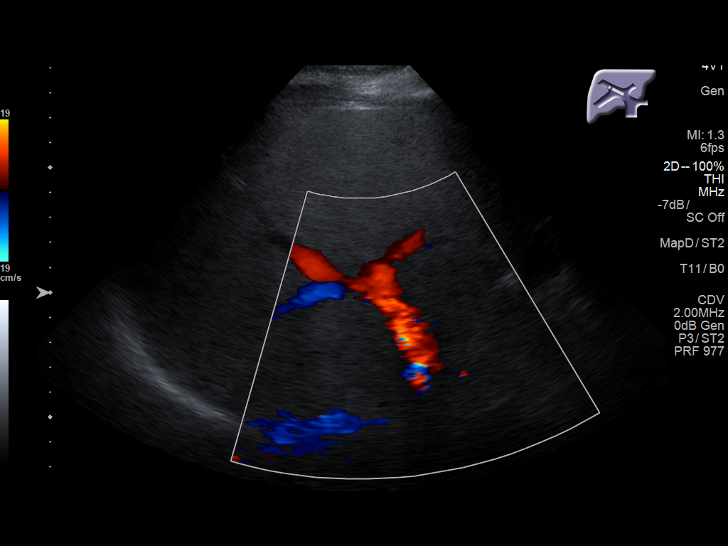
[im 41/50]
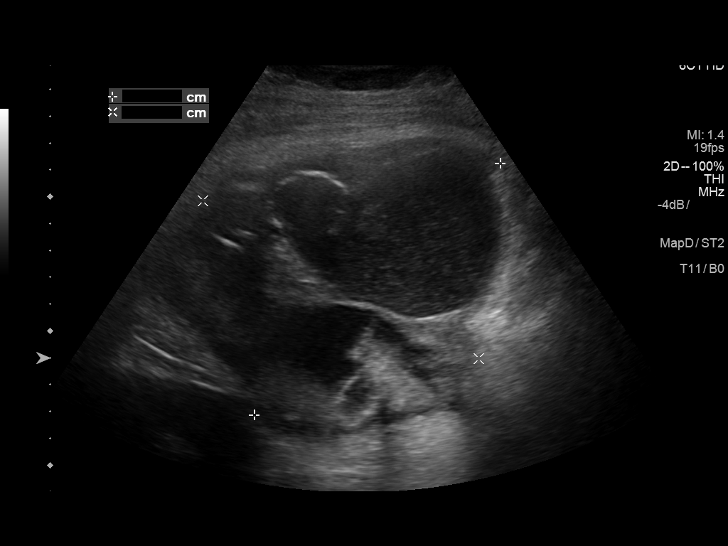
[im 45/50]
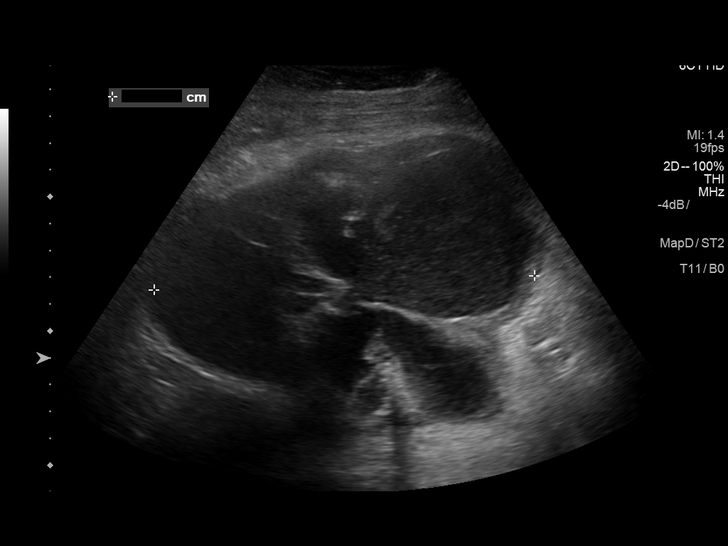
[im 50/50]
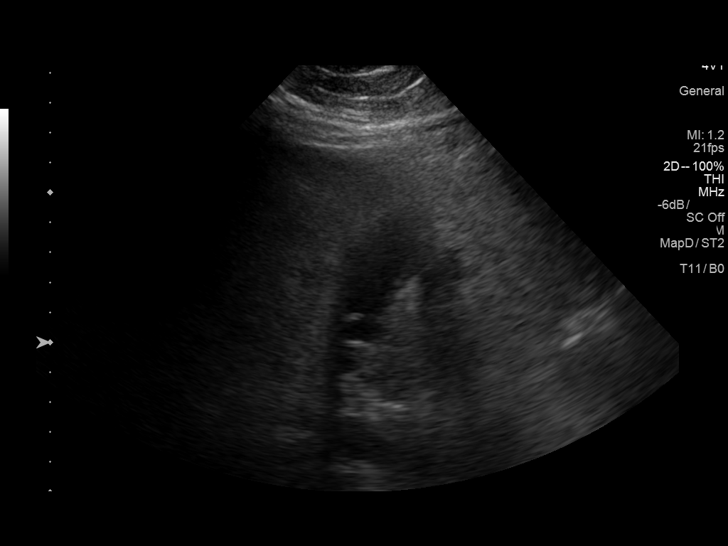

[14 of 25 positions shown; findings below may reference images not displayed]

FINDINGS: Gallbladder:

Multiple echogenic stones are identified measuring up to 1 cm
diameter. No gallbladder wall thickness. Sonographer reports no
sonographic Murphy sign.

Common bile duct:

Diameter: 4 mm

Liver:

Increased echogenicity with poor through transmission compatible
with steatosis.

Note: Sonographer Identifies a 13 x 11 x 14 cm complex cystic lesion
in the right lower quadrant, the region of pain as reported by the
patient.
IMPRESSION: 1. 14 cm complex cystic mass identified in the right lower quadrant
as identified as the area of concern by the patient. CT imaging of
the abdomen and pelvis with oral and intravenous contrast
recommended to further evaluate.
2. Cholelithiasis.
3. Steatosis

## 2018-04-04 ENCOUNTER — Other Ambulatory Visit: Payer: Self-pay

## 2018-04-04 MED ORDER — METFORMIN HCL ER 500 MG PO TB24: 500.0000 mg | ORAL_TABLET | Freq: Two times a day (BID) | ORAL | 0 refills | Status: DC

## 2018-04-04 MED ORDER — LISINOPRIL 10 MG PO TABS: 10.0000 mg | ORAL_TABLET | Freq: Every day | ORAL | 1 refills | Status: DC

## 2018-07-01 IMAGING — US US BREAST*R* LIMITED INC AXILLA
1 series · 4 of 4 positions shown · non-contrast
Comparison: Screening mammogram dated 06/16/2016.

CLINICAL DATA: Calcifications in the retroareolar left breast on
the craniocaudal view of a recent screening mammogram and possible
mass in the outer right breast, posteriorly, on the recent screening
mammogram.

EXAM:
2D DIGITAL DIAGNOSTIC BILATERAL MAMMOGRAM WITH CAD AND ADJUNCT TOMO
ULTRASOUND RIGHT BREAST

[Series 1: us breast*right* limited inc axilla · 0.07mm/px · 4 of 4 slices shown]
[im 1/4]
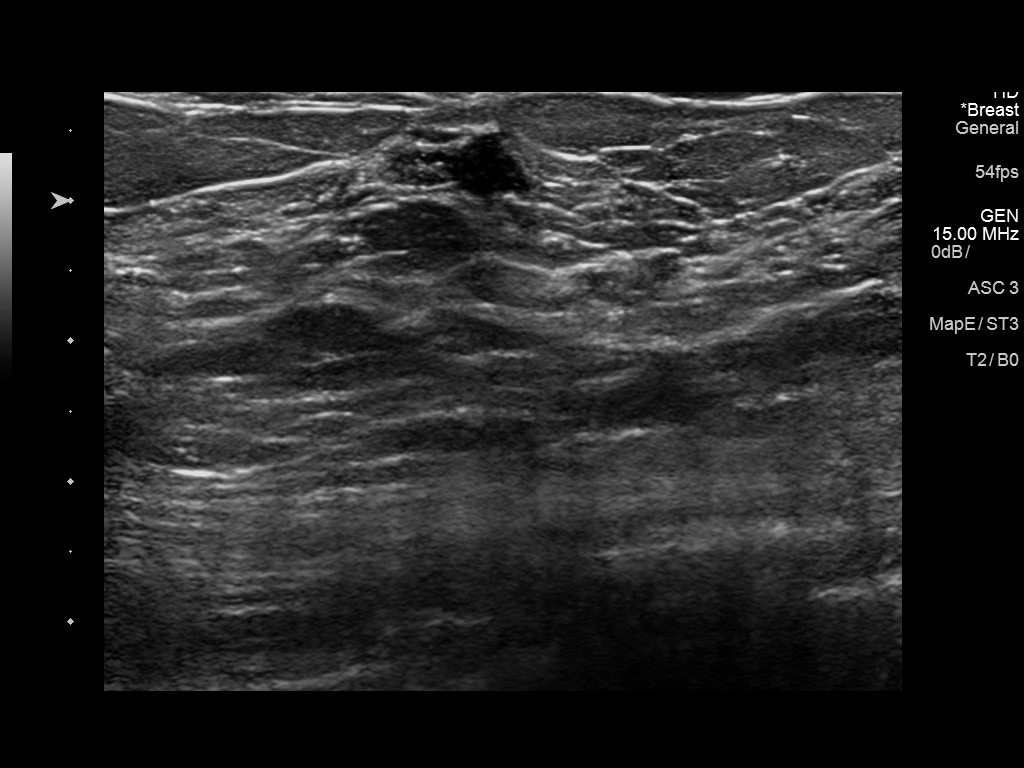
[im 2/4]
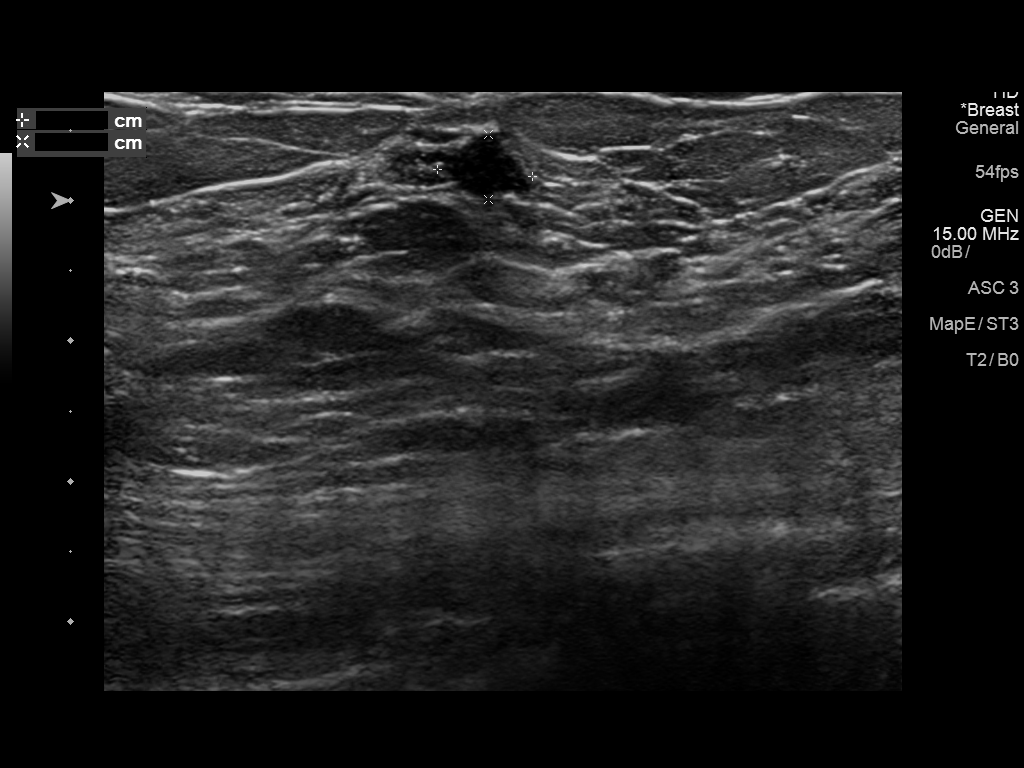
[im 3/4]
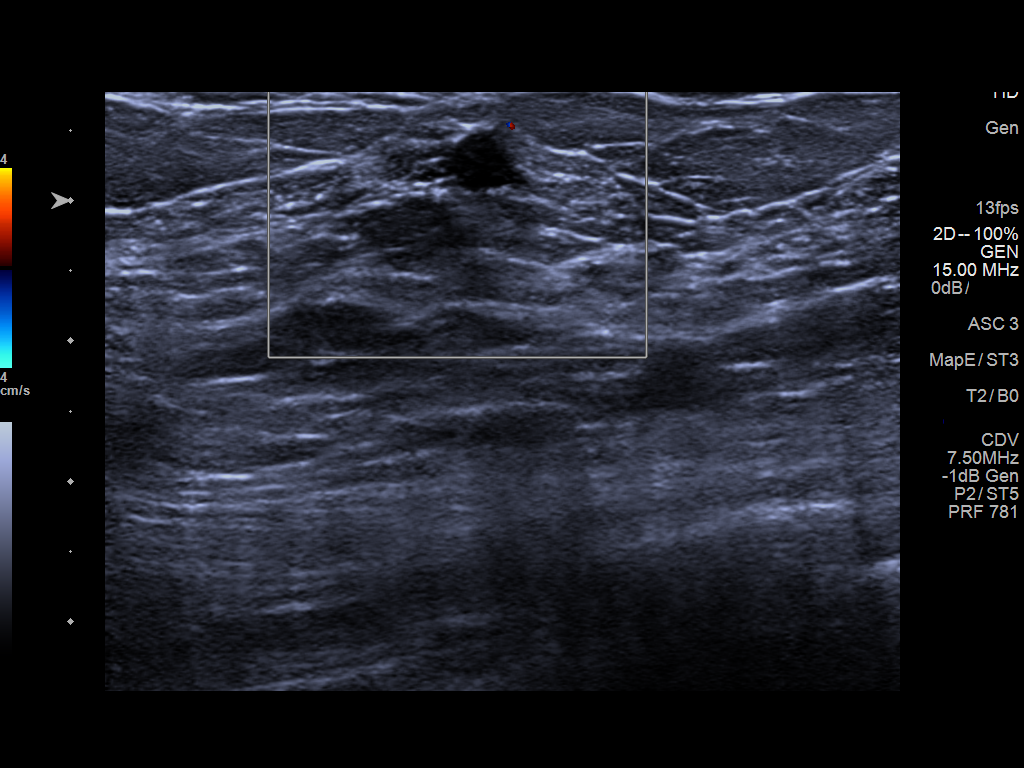
[im 4/4]
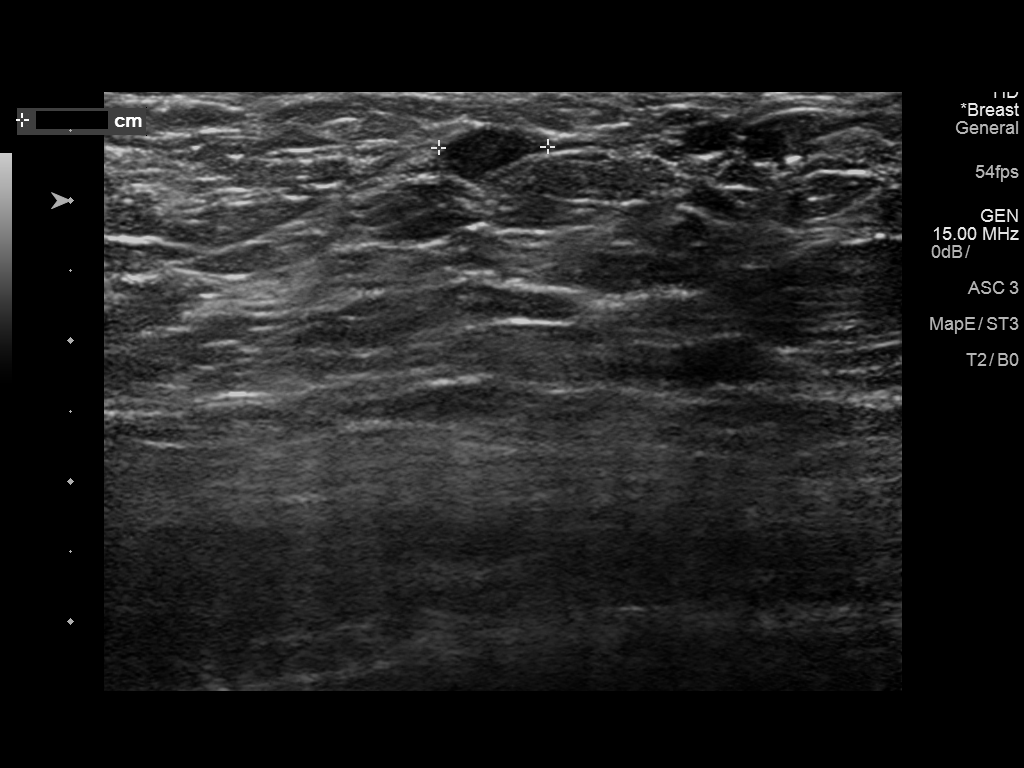

[4 of 4 positions shown; findings below may reference images not displayed]

ACR Breast Density Category c: The breast tissue is heterogeneously
dense, which may obscure small masses.
FINDINGS: 2D and 3D mammographic views of the left breast, including spot
magnification views, demonstrate loosely grouped tiny, punctate,
rounded and oval microcalcifications in the upper left breast
centered in the 12 o'clock position, anteriorly. These span an area
measuring approximately 5 x 3 x 3 cm. No associated mass or
architectural distortion.

2D and 3D tomographic images of the right breast confirm a 9 mm
oval, circumscribed mass with a fatty notch in the posterior aspect
of the breast, laterally, in approximately the 9 o'clock position.
There is also a diffuse nodular parenchymal pattern in the breast.

Mammographic images were processed with CAD.

On physical exam, no mass is palpable in the outer right breast.

Targeted ultrasound is performed, showing normal appearing breast
tissue throughout the outer right breast in the region of the
circumscribed mass. Closer to the nipple, in the 9 o'clock position,
2 cm from the nipple, a superficial oval, hypoechoic, circumscribed
mass is demonstrated. This does not contain internal blood flow with
color Doppler. This mass measures 8 x 7 x 5 mm.
IMPRESSION: 1. 5 x 3 x 3 cm area of loosely grouped, tiny, punctate
calcifications in the upper left breast centered in the 12 o'clock
position. These most likely represent calcifications associated with
fibrocystic tissue. However, DCIS cannot be excluded.
2. 8 mm probably benign mass in the 9 o'clock position of the right
breast, 2 cm from the nipple.
3. Probable sonographically occult benign intramammary lymph node in
the posterior aspect of the right breast in the 9 o'clock position.

RECOMMENDATION:
1. Stereotactic guided core needle biopsy of the microcalcifications
in the left breast. This will be arranged in consultation with the
patient's physician.
2. 2D and 3D right diagnostic mammogram and right breast ultrasound
in 6 months.

I have discussed the findings and recommendations with the patient.
Results were also provided in writing at the conclusion of the
visit. If applicable, a reminder letter will be sent to the patient
regarding the next appointment.

BI-RADS CATEGORY  4: Suspicious.

## 2018-08-16 ENCOUNTER — Other Ambulatory Visit: Payer: Self-pay | Admitting: Internal Medicine

## 2018-08-16 DIAGNOSIS — E119 Type 2 diabetes mellitus without complications: Secondary | ICD-10-CM

## 2018-08-16 MED ORDER — LINAGLIPTIN 5 MG PO TABS: 5.0000 mg | ORAL_TABLET | Freq: Every day | ORAL | 0 refills | Status: DC

## 2018-08-26 DIAGNOSIS — E1165 Type 2 diabetes mellitus with hyperglycemia: Secondary | ICD-10-CM | POA: Diagnosis not present

## 2018-08-28 DIAGNOSIS — E7849 Other hyperlipidemia: Secondary | ICD-10-CM | POA: Diagnosis not present

## 2018-08-28 DIAGNOSIS — R5381 Other malaise: Secondary | ICD-10-CM | POA: Diagnosis not present

## 2018-08-28 DIAGNOSIS — I10 Essential (primary) hypertension: Secondary | ICD-10-CM | POA: Diagnosis not present

## 2018-09-05 DIAGNOSIS — E1165 Type 2 diabetes mellitus with hyperglycemia: Secondary | ICD-10-CM | POA: Diagnosis not present

## 2018-09-05 DIAGNOSIS — I119 Hypertensive heart disease without heart failure: Secondary | ICD-10-CM | POA: Diagnosis not present

## 2020-04-06 ENCOUNTER — Encounter: Payer: Self-pay | Admitting: Internal Medicine

## 2020-04-06 ENCOUNTER — Ambulatory Visit (INDEPENDENT_AMBULATORY_CARE_PROVIDER_SITE_OTHER): Payer: BC Managed Care – PPO | Admitting: Internal Medicine

## 2020-04-06 ENCOUNTER — Other Ambulatory Visit: Payer: Self-pay

## 2020-04-06 VITALS — BP 165/111 | HR 82 | Temp 96.8°F | Ht 70.8 in | Wt 238.0 lb

## 2020-04-06 DIAGNOSIS — E119 Type 2 diabetes mellitus without complications: Secondary | ICD-10-CM | POA: Diagnosis not present

## 2020-04-06 DIAGNOSIS — E782 Mixed hyperlipidemia: Secondary | ICD-10-CM

## 2020-04-06 DIAGNOSIS — I10 Essential (primary) hypertension: Secondary | ICD-10-CM | POA: Diagnosis not present

## 2020-04-06 DIAGNOSIS — E669 Obesity, unspecified: Secondary | ICD-10-CM | POA: Diagnosis not present

## 2020-04-06 MED ORDER — ROSUVASTATIN CALCIUM 20 MG PO TABS: 20.0000 mg | ORAL_TABLET | Freq: Every day | ORAL | 3 refills | Status: DC

## 2020-04-06 MED ORDER — LISINOPRIL 10 MG PO TABS: 20.0000 mg | ORAL_TABLET | Freq: Every day | ORAL | 1 refills | Status: DC

## 2020-04-06 NOTE — Progress Notes (Signed)
Established Patient Office Visit  SUBJECTIVE:  Subjective  Patient ID: Kimberly Rowe, female    DOB: 1960-11-03  Age: 59 y.o. MRN: [id number]  CC:  Chief Complaint  Patient presents with  . Follow-up    on medication    HPI Kimberly Rowe is a 59 y.o. female presenting today for a medication refill.   Hypertension This is a chronic problem. The current episode started more than 1 year ago. The problem is unchanged. The problem is uncontrolled. Pertinent negatives include no chest pain, headaches or shortness of breath. Risk factors for coronary artery disease include obesity, post-menopausal state and diabetes mellitus. Past treatments include beta blockers and lifestyle changes.  Diabetes She has type 2 diabetes mellitus. Pertinent negatives for hypoglycemia include no headaches. Pertinent negatives for diabetes include no chest pain. Symptoms are stable. Risk factors for coronary artery disease include hypertension, obesity, sedentary lifestyle and post-menopausal. Current diabetic treatment includes diet and oral agent (monotherapy). She is compliant with treatment all of the time. She is following a generally healthy diet. There is no change in her home blood glucose trend. Eye exam is not current.     Past Medical History:  Diagnosis Date  . Chicken pox    age 59  . Diabetes mellitus without complication ([facility])   . Heart murmur   . Hypertension   . 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 Rosacea   . 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 UTI (urinary tract infection)     Past Surgical History:  Procedure Laterality Date  . ABDOMINAL HYSTERECTOMY  03/06/2016   Ex-lap, TAH, RSO, and drain placement on 03/06/16 for grossly infected benign ovarian mass.  Marland Kitchen BREAST BIOPSY Left 07/04/2016   Stereo- path unknown  . TONSILLECTOMY      Family History    Problem Relation Age of Onset  . Prostate cancer Father   . Arthritis Maternal Grandfather   . Breast cancer Neg Hx     Social History   Socioeconomic History  . Marital status: Married    Spouse name: Not on file  . Number of children: Not on file  . Years of education: Not on file  . Highest education level: Not on file  Occupational History  . Not on file  Tobacco Use  . Smoking status: Never Smoker  . Smokeless tobacco: Never Used  Substance and Sexual Activity  . Alcohol use: Yes    Comment: occ  . Drug use: No  . Sexual activity: Yes    Partners: Male  Other Topics Concern  . 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    Social Determinants of Health   Financial Resource Strain:   . Difficulty of Paying Living Expenses:   Food Insecurity:   . Worried About Charity fundraiser in the Last Year:   . Arboriculturist in the Last Year:   Transportation Needs:   . Film/video editor (Medical):   Marland Kitchen Lack of Transportation (Non-Medical):   Physical Activity:   . Days of Exercise per Week:   . Minutes of Exercise per Session:   Stress:   . Feeling of Stress :   Social Connections:   . Frequency  of Communication with Friends and Family:   . Frequency of Social Gatherings with Friends and Family:   . Attends Religious Services:   . Active Member of Clubs or Organizations:   . Attends Archivist Meetings:   Marland Kitchen Marital Status:   Intimate Partner Violence:   . Fear of Current or Ex-Partner:   . Emotionally Abused:   Marland Kitchen Physically Abused:   . Sexually Abused:      Current Outpatient Medications:  .  atorvastatin (LIPITOR) 40 MG tablet, Take 1 tablet (40 mg total) by mouth daily at 6 PM., Disp: 90 tablet, Rfl: 1 .  lisinopril (ZESTRIL) 10 MG tablet, Take 2 tablets (20 mg total) by mouth daily., Disp: 90 tablet, Rfl: 1 .  metFORMIN (GLUCOPHAGE-XR) 500 MG 24 hr tablet, Take 1  tablet (500 mg total) by mouth 2 (two) times daily., Disp: 180 tablet, Rfl: 0 .  rosuvastatin (CRESTOR) 20 MG tablet, Take 1 tablet (20 mg total) by mouth daily., Disp: 90 tablet, Rfl: 3   No Known Allergies  ROS Review of Systems  Constitutional: Negative.   HENT: Negative.   Eyes: Negative.   Respiratory: Negative.  Negative for cough and shortness of breath.   Cardiovascular: Negative.  Negative for chest pain.  Gastrointestinal: Negative.  Negative for abdominal pain.  Endocrine: Negative.   Genitourinary: Negative.   Musculoskeletal: Negative.   Skin: Negative.   Allergic/Immunologic: Negative.   Neurological: Negative.  Negative for headaches.  Hematological: Negative.   Psychiatric/Behavioral: Negative.   All other systems reviewed and are negative.    OBJECTIVE:    Physical Exam Vitals reviewed.  Constitutional:      Appearance: Normal appearance.  HENT:     Mouth/Throat:     Mouth: Mucous membranes are moist.  Eyes:     Pupils: Pupils are equal, round, and reactive to light.  Neck:     Vascular: No carotid bruit.  Cardiovascular:     Rate and Rhythm: Normal rate and regular rhythm.     Pulses: Normal pulses.     Heart sounds: Normal heart sounds.  Pulmonary:     Effort: Pulmonary effort is normal.     Breath sounds: Normal breath sounds.  Abdominal:     General: Bowel sounds are normal.     Palpations: Abdomen is soft. There is no hepatomegaly, splenomegaly or mass.     Tenderness: There is no abdominal tenderness.     Hernia: No hernia is present.  Musculoskeletal:        General: No tenderness.     Cervical back: Neck supple.     Right lower leg: Edema (trace) present.     Left lower leg: Edema (trace) present.  Skin:    Findings: Rash (face - Tx by dermatology) present.  Neurological:     Mental Status: She is alert and oriented to person, place, and time.     Motor: No weakness.  Psychiatric:        Mood and Affect: Mood and affect normal.          Behavior: Behavior normal.     BP (!) 165/111   Pulse 82   Temp (!) 96.8 F (36 C)   Ht 5' 10.8" (1.798 m)   Wt 238 lb (108 kg)   LMP 01/19/2016 (Exact Date)   BMI 33.38 kg/m  Wt Readings from Last 3 Encounters:  04/06/20 238 lb (108 kg)  07/25/17 251 lb 6 oz (114 kg)  01/10/17 234 lb  2 oz (106.2 kg)    Health Maintenance Due  Topic Date Due  . PNEUMOCOCCAL POLYSACCHARIDE VACCINE AGE 75-64 HIGH RISK  Never done  . COVID-19 Vaccine (1) Never done  . HIV Screening  Never done  . TETANUS/TDAP  Never done  . COLONOSCOPY  Never done  . FOOT EXAM  05/03/2017  . OPHTHALMOLOGY EXAM  05/31/2017  . HEMOGLOBIN A1C  01/23/2018  . MAMMOGRAM  06/16/2018  . INFLUENZA VACCINE  03/21/2020    There are no preventive care reminders to display for this patient.  CBC Latest Ref Rng & Units 07/25/2017 05/03/2016 03/02/2016  WBC 4.0 - 10.5 K/uL 4.7 5.2 19.6(H)  Hemoglobin 12.0 - 15.0 g/dL 14.4 12.9 11.9(L)  Hematocrit 36 - 46 % 43.2 37.9 35.9  Platelets 150 - 400 K/uL 176.0 237.0 277   CMP Latest Ref Rng & Units 07/25/2017 05/10/2016 05/03/2016  Glucose 70 - 99 mg/dL 164(H) 123(H) 100(H)  BUN 6 - 23 mg/dL 16 16 17   Creatinine 0.40 - 1.20 mg/dL 0.65 0.75 0.74  Sodium 135 - 145 mEq/L 138 139 139  Potassium 3.5 - 5.1 mEq/L 4.1 4.3 4.1  Chloride 96 - 112 mEq/L 104 106 106  CO2 19 - 32 mEq/L 27 27 27   Calcium 8.4 - 10.5 mg/dL 9.2 9.1 9.5  Total Protein 6.0 - 8.3 g/dL 7.1 - 7.6  Total Bilirubin 0.2 - 1.2 mg/dL 0.5 - 0.4  Alkaline Phos 39 - 117 U/L 90 - 62  AST 0 - 37 U/L 40(H) - 20  ALT 0 - 35 U/L 74(H) - 30    Lab Results  Component Value Date   TSH 0.41 07/25/2017   Lab Results  Component Value Date   ALBUMIN 4.0 07/25/2017   ANIONGAP 14 03/02/2016   GFR 100.66 07/25/2017   Lab Results  Component Value Date   CHOL 152 07/25/2017   CHOL 189 05/03/2016   HDL 53.30 07/25/2017   HDL 42.00 05/03/2016   LDLCALC 71 07/25/2017   CHOLHDL 3 07/25/2017   CHOLHDL 5 05/03/2016    Lab Results  Component Value Date   TRIG 139.0 07/25/2017   Lab Results  Component Value Date   HGBA1C 7.6 (H) 07/25/2017   HGBA1C 6.5 01/10/2017   HGBA1C 5.9 06/07/2016      ASSESSMENT & PLAN:   Problem List Items Addressed This Visit      Cardiovascular and Mediastinum   Essential hypertension - Primary    - Today, the patient's blood pressure is not well managed on lisinopril. - The patient will change the current treatment regimen to increase lisinopril to 20mg  PO daily.  - I encouraged the patient to eat a low-sodium diet to help control blood pressure. - I encouraged the patient to live an active lifestyle and complete activities that increases heart rate to 85% target heart rate at least 5 times per week for one hour.          Relevant Medications   lisinopril (ZESTRIL) 10 MG tablet   rosuvastatin (CRESTOR) 20 MG tablet     Endocrine   Type 2 diabetes mellitus without complication, without long-term current use of insulin (HCC)    Pt is taking metformin 500mg  PO daily. We are going to check her CBC, CMP, and Hgb A1C. She was advised to have an eye exam once per year.       Relevant Medications   lisinopril (ZESTRIL) 10 MG tablet   rosuvastatin (CRESTOR) 20 MG tablet  Other   Hyperlipidemia    - The patient's hyperlipidemia is not stable on Crestor 10mg . - The patient will change the current treatment regimen to Crestor 20mg  PO daily.  - I encouraged the patient to eat more vegetables and whole wheat, and to avoid fatty foods like whole milk, hard cheese, egg yolks, margarine, baked sweets, and fried foods.  - I encouraged the patient to live an active lifestyle and complete activities for 40 minutes at least three times per week.  - I instructed the patient to go to the ER if they begin having chest pain.        Relevant Medications   lisinopril (ZESTRIL) 10 MG tablet   rosuvastatin (CRESTOR) 20 MG tablet   Obesity (BMI 30.0-34.9)    - I encouraged  the patient to lose weight.  - I educated them on making healthy dietary choices including eating more fruits and vegetables and less fried foods. - I encouraged the patient to exercise more, and educated on the benefits of exercise including weight loss, diabetes management, and hypertension management.           Meds ordered this encounter  Medications  . lisinopril (ZESTRIL) 10 MG tablet    Sig: Take 2 tablets (20 mg total) by mouth daily.    Dispense:  90 tablet    Refill:  1  . rosuvastatin (CRESTOR) 20 MG tablet    Sig: Take 1 tablet (20 mg total) by mouth daily.    Dispense:  90 tablet    Refill:  3    Follow-up: No follow-ups on file.    Dr. Jane Canary [facility] [address] [place], [place] [address]   By signing my name below, I, [employer], attest that this documentation has been prepared under the direction and in the presence of Cletis Athens, MD. Electronically Signed: Cletis Athens, MD 04/06/20, 12:58 PM   I personally performed the services described in this documentation, which was SCRIBED in my presence. The recorded information has been reviewed and considered accurate. It has been edited as necessary during review. Cletis Athens, MD

## 2020-04-06 NOTE — Assessment & Plan Note (Signed)
-   I encouraged the patient to lose weight.  - I educated them on making healthy dietary choices including eating more fruits and vegetables and less fried foods. - I encouraged the patient to exercise more, and educated on the benefits of exercise including weight loss, diabetes management, and hypertension management.   

## 2020-04-06 NOTE — Assessment & Plan Note (Signed)
-   The patient's hyperlipidemia is not stable on Crestor 10mg . - The patient will change the current treatment regimen to Crestor 20mg  PO daily.  - I encouraged the patient to eat more vegetables and whole wheat, and to avoid fatty foods like whole milk, hard cheese, egg yolks, margarine, baked sweets, and fried foods.  - I encouraged the patient to live an active lifestyle and complete activities for 40 minutes at least three times per week.  - I instructed the patient to go to the ER if they begin having chest pain.

## 2020-04-06 NOTE — Progress Notes (Signed)
Kimberly Rowe is a 58 y.o. female here for a f/u on cholesterol medication

## 2020-04-06 NOTE — Assessment & Plan Note (Signed)
-   Today, the patient's blood pressure is not well managed on lisinopril. - The patient will change the current treatment regimen to increase lisinopril to 20mg  PO daily.  - I encouraged the patient to eat a low-sodium diet to help control blood pressure. - I encouraged the patient to live an active lifestyle and complete activities that increases heart rate to 85% target heart rate at least 5 times per week for one hour.

## 2020-04-06 NOTE — Assessment & Plan Note (Signed)
Pt is taking metformin 500mg  PO daily. We are going to check her CBC, CMP, and Hgb A1C. She was advised to have an eye exam once per year.

## 2020-04-13 ENCOUNTER — Other Ambulatory Visit: Payer: Self-pay

## 2020-04-13 ENCOUNTER — Ambulatory Visit (INDEPENDENT_AMBULATORY_CARE_PROVIDER_SITE_OTHER): Payer: BC Managed Care – PPO | Admitting: Internal Medicine

## 2020-04-13 DIAGNOSIS — I10 Essential (primary) hypertension: Secondary | ICD-10-CM

## 2020-04-13 DIAGNOSIS — E119 Type 2 diabetes mellitus without complications: Secondary | ICD-10-CM

## 2020-04-15 LAB — CBC WITH DIFFERENTIAL/PLATELET
Absolute Monocytes: 432 cells/uL (ref 200–950)
Basophils Absolute: 18 cells/uL (ref 0–200)
Basophils Relative: 0.4 %
Eosinophils Absolute: 60 cells/uL (ref 15–500)
Eosinophils Relative: 1.3 %
HCT: 49.5 % — ABNORMAL HIGH (ref 35.0–45.0)
Hemoglobin: 16.1 g/dL — ABNORMAL HIGH (ref 11.7–15.5)
Lymphs Abs: 1835 cells/uL (ref 850–3900)
MCH: 31.4 pg (ref 27.0–33.0)
MCHC: 32.5 g/dL (ref 32.0–36.0)
MCV: 96.5 fL (ref 80.0–100.0)
MPV: 11.2 fL (ref 7.5–12.5)
Monocytes Relative: 9.4 %
Neutro Abs: 2254 cells/uL (ref 1500–7800)
Neutrophils Relative %: 49 %
Platelets: 149 10*3/uL (ref 140–400)
RBC: 5.13 10*6/uL — ABNORMAL HIGH (ref 3.80–5.10)
RDW: 12.1 % (ref 11.0–15.0)
Total Lymphocyte: 39.9 %
WBC: 4.6 10*3/uL (ref 3.8–10.8)

## 2020-04-15 LAB — HEMOGLOBIN A1C
Hgb A1c MFr Bld: 12.1 % of total Hgb — ABNORMAL HIGH (ref <5.7)
Mean Plasma Glucose: 301 (calc)
eAG (mmol/L): 16.6 (calc)

## 2020-04-15 LAB — COMPLETE METABOLIC PANEL WITH GFR
AG Ratio: 1.5 (calc) (ref 1.0–2.5)
ALT: 80 U/L — ABNORMAL HIGH (ref 6–29)
AST: 51 U/L — ABNORMAL HIGH (ref 10–35)
Albumin: 4.3 g/dL (ref 3.6–5.1)
Alkaline phosphatase (APISO): 100 U/L (ref 37–153)
BUN: 20 mg/dL (ref 7–25)
CO2: 16 mmol/L — ABNORMAL LOW (ref 20–32)
Calcium: 9.4 mg/dL (ref 8.6–10.4)
Chloride: 102 mmol/L (ref 98–110)
Creat: 0.7 mg/dL (ref 0.50–1.05)
GFR, Est African American: 111 mL/min/{1.73_m2} (ref >=60)
GFR, Est Non African American: 96 mL/min/{1.73_m2} (ref >=60)
Globulin: 2.9 g/dL (calc) (ref 1.9–3.7)
Glucose, Bld: 226 mg/dL — ABNORMAL HIGH (ref 65–99)
Potassium: 5.3 mmol/L (ref 3.5–5.3)
Sodium: 132 mmol/L — ABNORMAL LOW (ref 135–146)
Total Bilirubin: 0.4 mg/dL (ref 0.2–1.2)
Total Protein: 7.2 g/dL (ref 6.1–8.1)

## 2020-04-15 LAB — MICROALBUMIN, URINE

## 2020-04-20 ENCOUNTER — Ambulatory Visit (INDEPENDENT_AMBULATORY_CARE_PROVIDER_SITE_OTHER): Payer: BC Managed Care – PPO | Admitting: Internal Medicine

## 2020-04-20 ENCOUNTER — Encounter: Payer: Self-pay | Admitting: Internal Medicine

## 2020-04-20 ENCOUNTER — Other Ambulatory Visit: Payer: Self-pay

## 2020-04-20 VITALS — BP 151/91 | HR 91 | Ht 69.0 in | Wt 237.2 lb

## 2020-04-20 DIAGNOSIS — E785 Hyperlipidemia, unspecified: Secondary | ICD-10-CM

## 2020-04-20 DIAGNOSIS — I10 Essential (primary) hypertension: Secondary | ICD-10-CM | POA: Diagnosis not present

## 2020-04-20 DIAGNOSIS — E669 Obesity, unspecified: Secondary | ICD-10-CM

## 2020-04-20 DIAGNOSIS — E119 Type 2 diabetes mellitus without complications: Secondary | ICD-10-CM

## 2020-04-20 MED ORDER — GLIMEPIRIDE 4 MG PO TABS: 4.0000 mg | ORAL_TABLET | Freq: Every day | ORAL | 3 refills | Status: DC

## 2020-04-20 NOTE — Patient Instructions (Signed)
Diabetes Mellitus and Nutrition, Adult  When you have diabetes (diabetes mellitus), it is very important to have healthy eating habits because your blood sugar (glucose) levels are greatly affected by what you eat and drink. Eating healthy foods in the appropriate amounts, at about the same times every day, can help you:  Control your blood glucose.  Lower your risk of heart disease.  Improve your blood pressure.  Reach or maintain a healthy weight.  Every person with diabetes is different, and each person has different needs for a meal plan. Your health care provider may recommend that you work with a diet and nutrition specialist (dietitian) to make a meal plan that is best for you. Your meal plan may vary depending on factors such as:  The calories you need.  The medicines you take.  Your weight.  Your blood glucose, blood pressure, and cholesterol levels.  Your activity level.  Other health conditions you have, such as heart or kidney disease.  How do carbohydrates affect me? Carbohydrates, also called carbs, affect your blood glucose level more than any other type of food. Eating carbs naturally raises the amount of glucose in your blood. Carb counting is a method for keeping track of how many carbs you eat. Counting carbs is important to keep your blood glucose at a healthy level, especially if you use insulin or take certain oral diabetes medicines. It is important to know how many carbs you can safely have in each meal. This is different for every person. Your dietitian can help you calculate how many carbs you should have at each meal and for each snack.  Foods that contain carbs include:  Bread, cereal, rice, pasta, and crackers.  Potatoes and corn.  Peas, beans, and lentils.  Milk and yogurt.  Fruit and juice.  Desserts, such as cakes, cookies, ice cream, and candy.  How does alcohol affect me? Alcohol can cause a sudden decrease in blood glucose  (hypoglycemia), especially if you use insulin or take certain oral diabetes medicines. Hypoglycemia can be a life-threatening condition. Symptoms of hypoglycemia (sleepiness, dizziness, and confusion) are similar to symptoms of having too much alcohol. If your health care provider says that alcohol is safe for you, follow these guidelines:  Limit alcohol intake to no more than 1 drink per day for nonpregnant women and 2 drinks per day for men. One drink equals 12 oz of beer, 5 oz of wine, or 1 oz of hard liquor.  Do not drink on an empty stomach.  Keep yourself hydrated with water, diet soda, or unsweetened iced tea.  Keep in mind that regular soda, juice, and other mixers may contain a lot of sugar and must be counted as carbs.  What are tips for following this plan?  Reading food labels  Start by checking the serving size on the "Nutrition Facts" label of packaged foods and drinks. The amount of calories, carbs, fats, and other nutrients listed on the label is based on one serving of the item. Many items contain more than one serving per package.  Check the total grams (g) of carbs in one serving. You can calculate the number of servings of carbs in one serving by dividing the total carbs by 15. For example, if a food has 30 g of total carbs, it would be equal to 2 servings of carbs.  Check the number of grams (g) of saturated and trans fats in one serving. Choose foods that have low or no amount of these  fats.  Check the number of milligrams (mg) of salt (sodium) in one serving. Most people should limit total sodium intake to less than 2,300 mg per day.  Always check the nutrition information of foods labeled as "low-fat" or "nonfat". These foods may be higher in added sugar or refined carbs and should be avoided.  Talk to your dietitian to identify your daily goals for nutrients listed on the label.  Shopping  Avoid buying canned, premade, or processed foods. These foods tend to be  high in fat, sodium, and added sugar.  Shop around the outside edge of the grocery store. This includes fresh fruits and vegetables, bulk grains, fresh meats, and fresh dairy.  Cooking  Use low-heat cooking methods, such as baking, instead of high-heat cooking methods like deep frying.  Cook using healthy oils, such as olive, canola, or sunflower oil.  Avoid cooking with butter, cream, or high-fat meats.  Meal planning 1. Eat meals and snacks regularly, preferably at the same times every day. Avoid going long periods of time without eating. 2. Eat foods high in fiber, such as fresh fruits, vegetables, beans, and whole grains. Talk to your dietitian about how many servings of carbs you can eat at each meal. 3. Eat 4-6 ounces (oz) of lean protein each day, such as lean meat, chicken, fish, eggs, or tofu. One oz of lean protein is equal to: ? 1 oz of meat, chicken, or fish. ? 1 egg. ?  cup of tofu. 4. Eat some foods each day that contain healthy fats, such as avocado, nuts, seeds, and fish.  Lifestyle 1. Check your blood glucose regularly. 2. Exercise regularly as told by your health care provider. This may include: ? 150 minutes of moderate-intensity or vigorous-intensity exercise each week. This could be brisk walking, biking, or water aerobics. ? Stretching and doing strength exercises, such as yoga or weightlifting, at least 2 times a week. 3. Take medicines as told by your health care provider. 4. Do not use any products that contain nicotine or tobacco, such as cigarettes and e-cigarettes. If you need help quitting, ask your health care provider. 5. Work with a Social worker or diabetes educator to identify strategies to manage stress and any emotional and social challenges.  Questions to ask a health care provider  Do I need to meet with a diabetes educator?  Do I need to meet with a dietitian?  What number can I call if I have questions?  When are the best times to check my  blood glucose?  Where to find more information:  American Diabetes Association: diabetes.org  Academy of Nutrition and Dietetics: www.eatright.[employer] of Diabetes and Digestive and Kidney Diseases (NIH): [employer]  Summary  A healthy meal plan will help you control your blood glucose and maintain a healthy lifestyle.  Working with a diet and nutrition specialist (dietitian) can help you make a meal plan that is best for you.  Keep in mind that carbohydrates (carbs) and alcohol have immediate effects on your blood glucose levels. It is important to count carbs and to use alcohol carefully.  This information is not intended to replace advice given to you by your health care provider. Make sure you discuss any questions you have with your health care provider.  Document Revised: 07/20/2017 Document Reviewed: 09/11/2016 Elsevier Patient Education  2020 [employer].

## 2020-04-20 NOTE — Progress Notes (Signed)
Established Patient Office Visit  SUBJECTIVE:  Subjective  Patient ID: Kimberly Rowe, female    DOB: 1961-06-15  Age: 59 y.o. MRN: [id number]  CC:  Chief Complaint  Patient presents with   lab results    HPI Kimberly Rowe is a 59 y.o. female presenting today for a discussion of her recent lab results.  Diabetes She presents for her follow-up diabetic visit. She has type 2 diabetes mellitus. Her disease course has been worsening. Pertinent negatives for diabetes include no chest pain, no fatigue and no weakness. Symptoms are stable. Risk factors for coronary artery disease include post-menopausal, obesity and diabetes mellitus. Current diabetic treatment includes oral agent (monotherapy). She is compliant with treatment all of the time. Her weight is stable. Her home blood glucose trend is fluctuating dramatically (80-200 depending on time of day). Eye exam is current.    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   BREAST BIOPSY Left 07/04/2016   Stereo- path unknown   TONSILLECTOMY      Family History  Problem Relation Age of Onset   Prostate cancer Father    Arthritis Maternal Grandfather    Breast cancer Neg Hx     Social History   Socioeconomic History   Marital status: Married    Spouse name: Not on file   Number of children: Not on file   Years of education: Not on file   Highest education level: Not on file    Occupational History   Not on file  Tobacco Use   Smoking status: Never Smoker   Smokeless tobacco: Never Used  Substance and Sexual Activity   Alcohol use: Yes    Comment: occ   Drug use: No   Sexual activity: Yes    Partners: Male  Other Topics Concern   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    Social Determinants of Health   Financial Resource Strain:    Difficulty of Paying Living Expenses: Not on file  Food Insecurity:    Worried About Charity fundraiser in the Last Year: Not on file   [employer] of Food in the Last Year: Not on file  Transportation Needs:    Lack of Transportation (Medical): Not on file   Lack of Transportation (Non-Medical): Not on file  Physical Activity:    Days of Exercise per Week: Not on file   Minutes of Exercise per Session: Not on file  Stress:    Feeling of Stress : Not on file  Social Connections:    Frequency of Communication with Friends and Family: Not on file   Frequency of Social Gatherings with Friends and Family: Not on file   Attends Religious Services: Not on file   Active Member  of Clubs or Organizations: Not on file   Attends Archivist Meetings: Not on file   Marital Status: Not on file  Intimate Partner Violence:    Fear of Current or Ex-Partner: Not on file   Emotionally Abused: Not on file   Physically Abused: Not on file   Sexually Abused: Not on file     Current Outpatient Medications:    atorvastatin (LIPITOR) 40 MG tablet, Take 1 tablet (40 mg total) by mouth daily at 6 PM., Disp: 90 tablet, Rfl: 1   lisinopril (ZESTRIL) 10 MG tablet, Take 2 tablets (20 mg total) by mouth daily., Disp: 90 tablet, Rfl: 1   metFORMIN (GLUCOPHAGE-XR) 500 MG 24 hr tablet, Take 1 tablet (500 mg total) by mouth 2 (two) times daily., Disp: 180 tablet, Rfl: 0   rosuvastatin (CRESTOR) 20 MG tablet, Take 1 tablet (20 mg  total) by mouth daily., Disp: 90 tablet, Rfl: 3   glimepiride (AMARYL) 4 MG tablet, Take 1 tablet (4 mg total) by mouth daily before breakfast., Disp: 30 tablet, Rfl: 3   No Known Allergies  ROS Review of Systems  Constitutional: Negative.  Negative for fatigue.  HENT: Negative.   Eyes: Negative.   Respiratory: Negative.   Cardiovascular: Negative.  Negative for chest pain.  Gastrointestinal: Negative.   Endocrine: Negative.   Genitourinary: Negative.   Musculoskeletal: Negative.   Skin: Negative.   Allergic/Immunologic: Negative.   Neurological: Negative.  Negative for weakness.  Hematological: Negative.   Psychiatric/Behavioral: Negative.   All other systems reviewed and are negative.    OBJECTIVE:    Physical Exam Vitals reviewed.  Constitutional:      Appearance: Normal appearance.  HENT:     Mouth/Throat:     Mouth: Mucous membranes are moist.  Eyes:     Pupils: Pupils are equal, round, and reactive to light.  Neck:     Vascular: No carotid bruit.  Cardiovascular:     Rate and Rhythm: Normal rate and regular rhythm.     Pulses: Normal pulses.     Heart sounds: Normal heart sounds.  Pulmonary:     Effort: Pulmonary effort is normal.     Breath sounds: Normal breath sounds.  Abdominal:     General: Bowel sounds are normal.     Palpations: Abdomen is soft. There is no hepatomegaly, splenomegaly or mass.     Tenderness: There is no abdominal tenderness.     Hernia: No hernia is present.  Musculoskeletal:        General: No tenderness.     Cervical back: Neck supple.     Right lower leg: No edema.     Left lower leg: No edema.  Skin:    Findings: No rash.  Neurological:     Mental Status: She is alert and oriented to person, place, and time.     Motor: No weakness.  Psychiatric:        Mood and Affect: Mood and affect normal.        Behavior: Behavior normal.     BP (!) 151/91    Pulse 91    Ht 5\' 9"  (1.753 m)    Wt 237 lb 3.2 oz (107.6 kg)    LMP  01/19/2016 (Exact Date)    BMI 35.03 kg/m  Wt Readings from Last 3 Encounters:  04/20/20 237 lb 3.2 oz (107.6 kg)  04/06/20 238 lb (108 kg)  07/25/17 251 lb 6 oz (114 kg)    Health Maintenance  Due  Topic Date Due   PNEUMOCOCCAL POLYSACCHARIDE VACCINE AGE 38-64 HIGH RISK  Never done   COVID-19 Vaccine (1) Never done   HIV Screening  Never done   TETANUS/TDAP  Never done   COLONOSCOPY  Never done   FOOT EXAM  05/03/2017   OPHTHALMOLOGY EXAM  05/31/2017   MAMMOGRAM  06/16/2018   INFLUENZA VACCINE  03/21/2020    There are no preventive care reminders to display for this patient.  CBC Latest Ref Rng & Units 04/13/2020 07/25/2017 05/03/2016  WBC 3.8 - 10.8 Thousand/uL 4.6 4.7 5.2  Hemoglobin 11.7 - 15.5 g/dL 16.1(H) 14.4 12.9  Hematocrit 35 - 45 % 49.5(H) 43.2 37.9  Platelets 140 - 400 Thousand/uL 149 176.0 237.0   CMP Latest Ref Rng & Units 04/13/2020 07/25/2017 05/10/2016  Glucose 65 - 99 mg/dL 226(H) 164(H) 123(H)  BUN 7 - 25 mg/dL 20 16 16   Creatinine 0.50 - 1.05 mg/dL 0.70 0.65 0.75  Sodium 135 - 146 mmol/L 132(L) 138 139  Potassium 3.5 - 5.3 mmol/L 5.3 4.1 4.3  Chloride 98 - 110 mmol/L 102 104 106  CO2 20 - 32 mmol/L 16(L) 27 27  Calcium 8.6 - 10.4 mg/dL 9.4 9.2 9.1  Total Protein 6.1 - 8.1 g/dL 7.2 7.1 -  Total Bilirubin 0.2 - 1.2 mg/dL 0.4 0.5 -  Alkaline Phos 39 - 117 U/L - 90 -  AST 10 - 35 U/L 51(H) 40(H) -  ALT 6 - 29 U/L 80(H) 74(H) -    Lab Results  Component Value Date   TSH 0.41 07/25/2017   Lab Results  Component Value Date   ALBUMIN 4.0 07/25/2017   ANIONGAP 14 03/02/2016   GFR 100.66 07/25/2017   Lab Results  Component Value Date   CHOL 152 07/25/2017   CHOL 189 05/03/2016   HDL 53.30 07/25/2017   HDL 42.00 05/03/2016   LDLCALC 71 07/25/2017   CHOLHDL 3 07/25/2017   CHOLHDL 5 05/03/2016   Lab Results  Component Value Date   TRIG 139.0 07/25/2017   Lab Results  Component Value Date   HGBA1C 12.1 (H) 04/13/2020   HGBA1C 7.6 (H)  07/25/2017   HGBA1C 6.5 01/10/2017      ASSESSMENT & PLAN:   Problem List Items Addressed This Visit      Cardiovascular and Mediastinum   Essential hypertension    - Today, the patient's blood pressure is well managed on lisinopril . - The patient will continue the current treatment regimen.  - I encouraged the patient to eat a low-sodium diet to help control blood pressure. - I encouraged the patient to live an active lifestyle and complete activities that increases heart rate to 85% target heart rate at least 5 times per week for one hour.            Endocrine   Type 2 diabetes mellitus without complication, without long-term current use of insulin ([place]) - Primary    - The patient's blood sugar is not under control on metformin. - The patient will change the current treatment regimen. Add  amaryl - I encouraged the patient to regularly check blood sugar.  - I encouraged the patient to monitor diet. I encouraged the patient to eat low-carb and low-sugar to help prevent blood sugar spikes.  - I encouraged the patient to continue following their prescribed treatment plan for diabetes - I informed the patient to get help if blood sugar drops below 54mg /dL, or if suddenly have trouble thinking clearly  or breathing.          Relevant Medications   glimepiride (AMARYL) 4 MG tablet     Other   Hyperlipidemia    - The patient's hyperlipidemia is stable on statin. - The patient will continue the current treatment regimen.  - I encouraged the patient to eat more vegetables and whole wheat, and to avoid fatty foods like whole milk, hard cheese, egg yolks, margarine, baked sweets, and fried foods.  - I encouraged the patient to live an active lifestyle and complete activities for 40 minutes at least three times per week.  - I instructed the patient to go to the ER if they begin having chest pain.       Obesity (BMI 30.0-34.9)    - I encouraged the patient to lose weight.  - I  educated them on making healthy dietary choices including eating more fruits and vegetables and less fried foods. - I encouraged the patient to exercise more, and educated on the benefits of exercise including weight loss, diabetes management, and hypertension management.           Meds ordered this encounter  Medications   glimepiride (AMARYL) 4 MG tablet    Sig: Take 1 tablet (4 mg total) by mouth daily before breakfast.    Dispense:  30 tablet    Refill:  3    Follow-up: No follow-ups on file.    Dr. Jane Canary [facility] [address], [place], [place] [address]   By signing my name below, I, [employer], attest that this documentation has been prepared under the direction and in the presence of Cletis Athens, MD. Electronically Signed: Cletis Athens, MD 04/20/20, 10:47 AM   I personally performed the services described in this documentation, which was SCRIBED in my presence. The recorded information has been reviewed and considered accurate. It has been edited as necessary during review. Cletis Athens, MD

## 2020-04-20 NOTE — Assessment & Plan Note (Signed)
-   Today, the patient's blood pressure is well managed on lisinopril. - The patient will continue the current treatment regimen.  - I encouraged the patient to eat a low-sodium diet to help control blood pressure. - I encouraged the patient to live an active lifestyle and complete activities that increases heart rate to 85% target heart rate at least 5 times per week for one hour.     

## 2020-04-20 NOTE — Assessment & Plan Note (Signed)
-   I encouraged the patient to lose weight.  - I educated them on making healthy dietary choices including eating more fruits and vegetables and less fried foods. - I encouraged the patient to exercise more, and educated on the benefits of exercise including weight loss, diabetes management, and hypertension management.   

## 2020-04-20 NOTE — Assessment & Plan Note (Signed)

## 2020-04-20 NOTE — Assessment & Plan Note (Signed)
-   The patient's blood sugar is not under control on metformin. - The patient will change the current treatment regimen. Add  amaryl - I encouraged the patient to regularly check blood sugar.  - I encouraged the patient to monitor diet. I encouraged the patient to eat low-carb and low-sugar to help prevent blood sugar spikes.  - I encouraged the patient to continue following their prescribed treatment plan for diabetes - I informed the patient to get help if blood sugar drops below 54mg /dL, or if suddenly have trouble thinking clearly or breathing.

## 2020-05-11 ENCOUNTER — Ambulatory Visit: Payer: BC Managed Care – PPO | Admitting: Internal Medicine

## 2020-05-20 ENCOUNTER — Encounter: Payer: Self-pay | Admitting: Internal Medicine

## 2020-05-20 ENCOUNTER — Ambulatory Visit (INDEPENDENT_AMBULATORY_CARE_PROVIDER_SITE_OTHER): Payer: BC Managed Care – PPO | Admitting: Internal Medicine

## 2020-05-20 ENCOUNTER — Other Ambulatory Visit: Payer: Self-pay

## 2020-05-20 VITALS — BP 155/96 | HR 80 | Ht 67.0 in | Wt 232.0 lb

## 2020-05-20 DIAGNOSIS — I10 Essential (primary) hypertension: Secondary | ICD-10-CM | POA: Diagnosis not present

## 2020-05-20 DIAGNOSIS — E785 Hyperlipidemia, unspecified: Secondary | ICD-10-CM

## 2020-05-20 DIAGNOSIS — E669 Obesity, unspecified: Secondary | ICD-10-CM

## 2020-05-20 DIAGNOSIS — E119 Type 2 diabetes mellitus without complications: Secondary | ICD-10-CM

## 2020-05-20 NOTE — Assessment & Plan Note (Signed)
-   The patient's blood sugar is not under control on glimpride  And metformin. - The patient will continue the current treatment regimen.  - I encouraged the patient to regularly check blood sugar.  - I encouraged the patient to monitor diet. I encouraged the patient to eat low-carb and low-sugar to help prevent blood sugar spikes.  - I encouraged the patient to continue following their prescribed treatment plan for diabetes - I informed the patient to get help if blood sugar drops below 54mg /dL, or if suddenly have trouble thinking clearly or breathing.

## 2020-05-20 NOTE — Assessment & Plan Note (Signed)
-   I encouraged the patient to lose weight.  - I educated them on making healthy dietary choices including eating more fruits and vegetables and less fried foods. - I encouraged the patient to exercise more, and educated on the benefits of exercise including weight loss, diabetes management, and hypertension management.   

## 2020-05-20 NOTE — Progress Notes (Signed)
Established Patient Office Visit  SUBJECTIVE:  Subjective  Patient ID: Kimberly Rowe, female    DOB: October 27, 1960  Age: 59 y.o. MRN: [id number]  CC:  Chief Complaint  Patient presents with  . Diabetes    blood sugar checked at home was 182    HPI Kimberly Rowe is a 59 y.o. female presenting today for a diabetes recheck.  Diabetes She presents for her follow-up diabetic visit. She has type 2 diabetes mellitus. No MedicAlert identification noted. Her disease course has been improving. Pertinent negatives for diabetes include no blurred vision, no chest pain, no fatigue and no visual change. Risk factors for coronary artery disease include diabetes mellitus, hypertension, post-menopausal, stress and obesity. Current diabetic treatment includes diet and oral agent (dual therapy). She is compliant with treatment all of the time. She is following a diabetic and generally healthy diet. She has not had a previous visit with a dietitian. Exercise: walks regularly for job.     Past Medical History:  Diagnosis Date  . Chicken pox    age 32  . Diabetes mellitus without complication ([place])   . Heart murmur   . Hypertension   . Pulmonary embolism ([place])    After surgery for ovarian tumor  . Rosacea   . 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 UTI (urinary tract infection)     Past Surgical History:  Procedure Laterality Date  . ABDOMINAL HYSTERECTOMY  03/06/2016   Ex-lap, TAH, RSO, and drain placement on 03/06/16 for grossly infected benign ovarian mass.  Marland Kitchen BREAST BIOPSY Left 07/04/2016   Stereo- path unknown  . TONSILLECTOMY      Family History  Problem Relation Age of Onset  . Prostate cancer Father   . Arthritis Maternal Grandfather   . Breast cancer Neg Hx     Social History   Socioeconomic History  . Marital status: Married    Spouse name: Not on file  . Number of children: Not  on file  . Years of education: Not on file  . Highest education level: Not on file  Occupational History  . Not on file  Tobacco Use  . Smoking status: Never Smoker  . Smokeless tobacco: Never Used  Substance and Sexual Activity  . Alcohol use: Yes    Comment: occ  . Drug use: No  . Sexual activity: Yes    Partners: Male  Other Topics Concern  . 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    Social Determinants of Health   Financial Resource Strain:   . Difficulty of Paying Living Expenses: Not on file  Food Insecurity:   . Worried About Charity fundraiser in the Last Year: Not on file  . Ran Out of Food in the Last Year: Not on file  Transportation Needs:   . Lack of Transportation (Medical): Not on file  . Lack of Transportation (Non-Medical): Not on file  Physical Activity:   . Days of Exercise per Week: Not on file  . Minutes of Exercise per Session: Not on file  Stress:   . Feeling of Stress : Not on file  Social Connections:   . Frequency of Communication with Friends and Family: Not on file  . Frequency of Social Gatherings with Friends  and Family: Not on file  . Attends Religious Services: Not on file  . Active Member of Clubs or Organizations: Not on file  . Attends Archivist Meetings: Not on file  . Marital Status: Not on file  Intimate Partner Violence:   . Fear of Current or Ex-Partner: Not on file  . Emotionally Abused: Not on file  . Physically Abused: Not on file  . Sexually Abused: Not on file     Current Outpatient Medications:  .  glimepiride (AMARYL) 4 MG tablet, Take 1 tablet (4 mg total) by mouth daily before breakfast., Disp: 30 tablet, Rfl: 3 .  lisinopril (ZESTRIL) 10 MG tablet, Take 2 tablets (20 mg total) by mouth daily., Disp: 90 tablet, Rfl: 1 .  metFORMIN (GLUCOPHAGE-XR) 500 MG 24 hr tablet, Take 1 tablet (500 mg total) by mouth 2 (two) times daily.,  Disp: 180 tablet, Rfl: 0 .  rosuvastatin (CRESTOR) 20 MG tablet, Take 1 tablet (20 mg total) by mouth daily., Disp: 90 tablet, Rfl: 3   No Known Allergies  ROS Review of Systems  Constitutional: Negative.  Negative for fatigue.  HENT: Negative.   Eyes: Negative.  Negative for blurred vision.  Respiratory: Negative for chest tightness and shortness of breath.   Cardiovascular: Negative.  Negative for chest pain and leg swelling.  Gastrointestinal: Negative.  Negative for abdominal pain.  Endocrine: Negative.   Genitourinary: Negative.   Musculoskeletal: Negative.   Skin: Negative.   Allergic/Immunologic: Negative.   Neurological: Negative.   Hematological: Negative.   Psychiatric/Behavioral: Negative.   All other systems reviewed and are negative.    OBJECTIVE:    Physical Exam Vitals reviewed.  Constitutional:      Appearance: Normal appearance. She is obese.  HENT:     Mouth/Throat:     Mouth: Mucous membranes are moist.  Eyes:     Pupils: Pupils are equal, round, and reactive to light.  Neck:     Vascular: No carotid bruit.  Cardiovascular:     Rate and Rhythm: Normal rate and regular rhythm.     Pulses: Normal pulses.     Heart sounds: Normal heart sounds.  Pulmonary:     Effort: Pulmonary effort is normal.     Breath sounds: Normal breath sounds.  Abdominal:     General: Bowel sounds are normal.     Palpations: Abdomen is soft. There is no hepatomegaly, splenomegaly or mass.     Tenderness: There is no abdominal tenderness.     Hernia: No hernia is present.  Musculoskeletal:        General: No tenderness.     Cervical back: Neck supple.     Right lower leg: No edema.     Left lower leg: No edema.  Skin:    Findings: No rash.  Neurological:     Mental Status: She is alert and oriented to person, place, and time.     Motor: No weakness.  Psychiatric:        Mood and Affect: Mood and affect normal.        Behavior: Behavior normal.     BP (!) 155/96    Pulse 80   Ht 5\' 7"  (1.702 m)   Wt 232 lb (105.2 kg)   LMP 01/19/2016 (Exact Date)   BMI 36.34 kg/m  Wt Readings from Last 3 Encounters:  05/20/20 232 lb (105.2 kg)  04/20/20 237 lb 3.2 oz (107.6 kg)  04/06/20 238 lb (108 kg)  Health Maintenance Due  Topic Date Due  . PNEUMOCOCCAL POLYSACCHARIDE VACCINE AGE 59-64 HIGH RISK  Never done  . COVID-19 Vaccine (1) Never done  . HIV Screening  Never done  . TETANUS/TDAP  Never done  . COLONOSCOPY  Never done  . FOOT EXAM  05/03/2017  . OPHTHALMOLOGY EXAM  05/31/2017  . MAMMOGRAM  06/16/2018  . INFLUENZA VACCINE  Never done    There are no preventive care reminders to display for this patient.  CBC Latest Ref Rng & Units 04/13/2020 07/25/2017 05/03/2016  WBC 3.8 - 10.8 Thousand/uL 4.6 4.7 5.2  Hemoglobin 11.7 - 15.5 g/dL 16.1(H) 14.4 12.9  Hematocrit 35 - 45 % 49.5(H) 43.2 37.9  Platelets 140 - 400 Thousand/uL 149 176.0 237.0   CMP Latest Ref Rng & Units 04/13/2020 07/25/2017 05/10/2016  Glucose 65 - 99 mg/dL 226(H) 164(H) 123(H)  BUN 7 - 25 mg/dL 20 16 16   Creatinine 0.50 - 1.05 mg/dL 0.70 0.65 0.75  Sodium 135 - 146 mmol/L 132(L) 138 139  Potassium 3.5 - 5.3 mmol/L 5.3 4.1 4.3  Chloride 98 - 110 mmol/L 102 104 106  CO2 20 - 32 mmol/L 16(L) 27 27  Calcium 8.6 - 10.4 mg/dL 9.4 9.2 9.1  Total Protein 6.1 - 8.1 g/dL 7.2 7.1 -  Total Bilirubin 0.2 - 1.2 mg/dL 0.4 0.5 -  Alkaline Phos 39 - 117 U/L - 90 -  AST 10 - 35 U/L 51(H) 40(H) -  ALT 6 - 29 U/L 80(H) 74(H) -    Lab Results  Component Value Date   TSH 0.41 07/25/2017   Lab Results  Component Value Date   ALBUMIN 4.0 07/25/2017   ANIONGAP 14 03/02/2016   GFR 100.66 07/25/2017   Lab Results  Component Value Date   CHOL 152 07/25/2017   CHOL 189 05/03/2016   HDL 53.30 07/25/2017   HDL 42.00 05/03/2016   LDLCALC 71 07/25/2017   CHOLHDL 3 07/25/2017   CHOLHDL 5 05/03/2016   Lab Results  Component Value Date   TRIG 139.0 07/25/2017   Lab Results  Component  Value Date   HGBA1C 12.1 (H) 04/13/2020   HGBA1C 7.6 (H) 07/25/2017   HGBA1C 6.5 01/10/2017      ASSESSMENT & PLAN:   Problem List Items Addressed This Visit      Cardiovascular and Mediastinum   Essential hypertension - Primary    - Today, the patient's blood pressure is well managed on lisinopril. - The patient will continue the current treatment regimen.  - I encouraged the patient to eat a low-sodium diet to help control blood pressure. - I encouraged the patient to live an active lifestyle and complete activities that increases heart rate to 85% target heart rate at least 5 times per week for one hour.           Endocrine   Type 2 diabetes mellitus without complication, without long-term current use of insulin ([place])    - The patient's blood sugar is not under control on glimpride  And metformin. - The patient will continue the current treatment regimen.  - I encouraged the patient to regularly check blood sugar.  - I encouraged the patient to monitor diet. I encouraged the patient to eat low-carb and low-sugar to help prevent blood sugar spikes.  - I encouraged the patient to continue following their prescribed treatment plan for diabetes - I informed the patient to get help if blood sugar drops below 54mg /dL, or if suddenly have trouble  thinking clearly or breathing.           Other   Hyperlipidemia    - The patient's hyperlipidemia is stable on crestor. - The patient will continue the current treatment regimen.  - I encouraged the patient to eat more vegetables and whole wheat, and to avoid fatty foods like whole milk, hard cheese, egg yolks, margarine, baked sweets, and fried foods.  - I encouraged the patient to live an active lifestyle and complete activities for 40 minutes at least three times per week.  - I instructed the patient to go to the ER if they begin having chest pain.        Obesity (BMI 30.0-34.9)    - I encouraged the patient to lose weight.  - I  educated them on making healthy dietary choices including eating more fruits and vegetables and less fried foods. - I encouraged the patient to exercise more, and educated on the benefits of exercise including weight loss, diabetes management, and hypertension management.          No orders of the defined types were placed in this encounter.   Follow-up: No follow-ups on file.    Cletis Athens, MD [facility] [address] [place], [place] [address]   By signing my name below, I, [employer], attest that this documentation has been prepared under the direction and in the presence of Dr. Cletis Athens Electronically Signed: Cletis Athens, MD 05/20/20, 9:36 AM  I personally performed the services described in this documentation, which was SCRIBED in my presence. The recorded information has been reviewed and considered accurate. It has been edited as necessary during review. Cletis Athens, MD

## 2020-05-20 NOTE — Assessment & Plan Note (Signed)
-   Today, the patient's blood pressure is well managed on lisinopril. - The patient will continue the current treatment regimen.  - I encouraged the patient to eat a low-sodium diet to help control blood pressure. - I encouraged the patient to live an active lifestyle and complete activities that increases heart rate to 85% target heart rate at least 5 times per week for one hour.     

## 2020-05-20 NOTE — Assessment & Plan Note (Signed)
-   The patient's hyperlipidemia is stable on crestor. - The patient will continue the current treatment regimen.  - I encouraged the patient to eat more vegetables and whole wheat, and to avoid fatty foods like whole milk, hard cheese, egg yolks, margarine, baked sweets, and fried foods.  - I encouraged the patient to live an active lifestyle and complete activities for 40 minutes at least three times per week.  - I instructed the patient to go to the ER if they begin having chest pain.   

## 2020-05-24 ENCOUNTER — Other Ambulatory Visit: Payer: Self-pay | Admitting: *Deleted

## 2020-05-24 MED ORDER — METFORMIN HCL ER 500 MG PO TB24: 500.0000 mg | ORAL_TABLET | Freq: Two times a day (BID) | ORAL | 3 refills | Status: DC

## 2020-05-28 ENCOUNTER — Other Ambulatory Visit: Payer: Self-pay | Admitting: *Deleted

## 2020-05-28 MED ORDER — METFORMIN HCL ER 500 MG PO TB24: 500.0000 mg | ORAL_TABLET | Freq: Two times a day (BID) | ORAL | 3 refills | Status: DC

## 2020-07-16 ENCOUNTER — Other Ambulatory Visit: Payer: Self-pay | Admitting: Internal Medicine

## 2020-07-16 DIAGNOSIS — I10 Essential (primary) hypertension: Secondary | ICD-10-CM

## 2020-09-13 ENCOUNTER — Other Ambulatory Visit: Payer: Self-pay | Admitting: Internal Medicine

## 2020-09-13 DIAGNOSIS — E119 Type 2 diabetes mellitus without complications: Secondary | ICD-10-CM

## 2020-09-13 DIAGNOSIS — I10 Essential (primary) hypertension: Secondary | ICD-10-CM

## 2020-11-23 ENCOUNTER — Other Ambulatory Visit: Payer: Self-pay

## 2020-11-23 DIAGNOSIS — I10 Essential (primary) hypertension: Secondary | ICD-10-CM

## 2020-11-23 MED ORDER — LISINOPRIL 10 MG PO TABS: 2.0000 | ORAL_TABLET | Freq: Every day | ORAL | 0 refills | Status: DC

## 2021-03-17 ENCOUNTER — Other Ambulatory Visit: Payer: Self-pay | Admitting: Internal Medicine

## 2021-03-17 DIAGNOSIS — E119 Type 2 diabetes mellitus without complications: Secondary | ICD-10-CM

## 2021-03-22 ENCOUNTER — Other Ambulatory Visit: Payer: Self-pay

## 2021-03-22 ENCOUNTER — Other Ambulatory Visit: Payer: Self-pay | Admitting: *Deleted

## 2021-03-22 ENCOUNTER — Ambulatory Visit: Payer: BC Managed Care – PPO | Admitting: Internal Medicine

## 2021-03-22 DIAGNOSIS — I10 Essential (primary) hypertension: Secondary | ICD-10-CM

## 2021-03-22 MED ORDER — LISINOPRIL 10 MG PO TABS: 20.0000 mg | ORAL_TABLET | Freq: Every day | ORAL | 0 refills | Status: DC

## 2021-04-19 ENCOUNTER — Other Ambulatory Visit: Payer: Self-pay

## 2021-04-19 ENCOUNTER — Ambulatory Visit (INDEPENDENT_AMBULATORY_CARE_PROVIDER_SITE_OTHER): Payer: BC Managed Care – PPO | Admitting: Internal Medicine

## 2021-04-19 ENCOUNTER — Encounter: Payer: Self-pay | Admitting: Internal Medicine

## 2021-04-19 VITALS — BP 147/93 | HR 74 | Ht 67.0 in | Wt 237.8 lb

## 2021-04-19 DIAGNOSIS — I10 Essential (primary) hypertension: Secondary | ICD-10-CM | POA: Diagnosis not present

## 2021-04-19 DIAGNOSIS — E119 Type 2 diabetes mellitus without complications: Secondary | ICD-10-CM | POA: Diagnosis not present

## 2021-04-19 DIAGNOSIS — E669 Obesity, unspecified: Secondary | ICD-10-CM

## 2021-04-19 DIAGNOSIS — E785 Hyperlipidemia, unspecified: Secondary | ICD-10-CM | POA: Diagnosis not present

## 2021-04-19 DIAGNOSIS — R011 Cardiac murmur, unspecified: Secondary | ICD-10-CM | POA: Diagnosis not present

## 2021-04-19 DIAGNOSIS — E782 Mixed hyperlipidemia: Secondary | ICD-10-CM

## 2021-04-19 LAB — POCT GLYCOSYLATED HEMOGLOBIN (HGB A1C): HbA1c POC (<> result, manual entry): 8.6 %

## 2021-04-19 LAB — GLUCOSE, POCT (MANUAL RESULT ENTRY): POC Glucose: 224 mg/dl — AB (ref 70–99)

## 2021-04-19 MED ORDER — GLIMEPIRIDE 4 MG PO TABS: 4.0000 mg | ORAL_TABLET | Freq: Every day | ORAL | 3 refills | Status: DC

## 2021-04-19 MED ORDER — LISINOPRIL 10 MG PO TABS: 20.0000 mg | ORAL_TABLET | Freq: Every day | ORAL | 3 refills | Status: DC

## 2021-04-19 MED ORDER — ROSUVASTATIN CALCIUM 20 MG PO TABS
20.0000 mg | ORAL_TABLET | Freq: Every day | ORAL | 3 refills | Status: DC
Start: 2021-04-19 — End: 2022-05-01

## 2021-04-19 NOTE — Progress Notes (Signed)
Established Patient Office Visit  Subjective:  Patient ID: Kimberly Rowe, female    DOB: Jun 12, 1961  Age: 60 y.o. MRN: [id number]  CC:  Chief Complaint  Patient presents with   Diabetes    Diabetes She presents for her follow-up diabetic visit. She has type 2 diabetes mellitus. The initial diagnosis of diabetes was made 4 years ago. Her disease course has been improving. Pertinent negatives for hypoglycemia include no dizziness, headaches, mood changes, nervousness/anxiousness or seizures. Pertinent negatives for diabetes include no chest pain, no fatigue, no foot paresthesias, no polydipsia and no polyphagia. Pertinent negatives for hypoglycemia complications include no hospitalization. Pertinent negatives for diabetic complications include no nephropathy, PVD or retinopathy.   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   BREAST BIOPSY Left 07/04/2016   Stereo- path unknown   TONSILLECTOMY      Family History  Problem Relation Age of Onset   Prostate cancer Father    Arthritis Maternal Grandfather    Breast cancer Neg Hx     Social History   Socioeconomic History   Marital status: Married    Spouse name: Not on file   Number of children: Not on file   Years of education: Not on file   Highest education level: Not on file  Occupational History   Not on file  Tobacco Use   Smoking status: Never   Smokeless  tobacco: Never  Substance and Sexual Activity   Alcohol use: Yes    Comment: occ   Drug use: No   Sexual activity: Yes    Partners: Male  Other Topics Concern   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    Social Determinants of Health   Financial Resource Strain: Not on file  Food Insecurity: Not on file  Transportation Needs: Not on file  Physical Activity: Not on file  Stress: Not on file  Social Connections: Not on file  Intimate Partner Violence: Not on file     Current Outpatient Medications:    glimepiride (AMARYL) 4 MG tablet, Take 1 tablet (4 mg total) by mouth daily before breakfast., Disp: 90 tablet, Rfl: 3   lisinopril (ZESTRIL) 10 MG tablet, Take 2 tablets (20 mg total) by mouth daily., Disp: 180 tablet, Rfl: 3   metFORMIN (GLUCOPHAGE-XR) 500 MG 24 hr tablet, Take 1 tablet (500 mg total) by mouth 2 (two) times daily., Disp: 180 tablet, Rfl: 3   rosuvastatin (CRESTOR) 20 MG tablet, Take 1 tablet (20 mg total) by mouth daily., Disp: 90 tablet, Rfl: 3   No Known Allergies  ROS Review of Systems  Constitutional: Negative.  Negative for fatigue.  HENT: Negative.    Eyes: Negative.   Respiratory: Negative.  Cardiovascular: Negative.  Negative for chest pain.  Gastrointestinal: Negative.   Endocrine: Negative.  Negative for polydipsia and polyphagia.  Genitourinary: Negative.   Musculoskeletal: Negative.   Skin: Negative.   Allergic/Immunologic: Negative.   Neurological: Negative.  Negative for dizziness, seizures and headaches.  Hematological: Negative.   Psychiatric/Behavioral: Negative.  The patient is not nervous/anxious.   All other systems reviewed and are negative.    Objective:    Physical Exam Vitals reviewed.  Constitutional:      Appearance: Normal appearance.  HENT:     Mouth/Throat:     Mouth: Mucous membranes are moist.  Eyes:     Pupils: Pupils are equal,  round, and reactive to light.  Neck:     Vascular: No carotid bruit.  Cardiovascular:     Rate and Rhythm: Normal rate and regular rhythm.     Pulses: Normal pulses.     Heart sounds: Normal heart sounds.  Pulmonary:     Effort: Pulmonary effort is normal.     Breath sounds: Normal breath sounds.  Abdominal:     General: Bowel sounds are normal.     Palpations: Abdomen is soft. There is no hepatomegaly, splenomegaly or mass.     Tenderness: There is no abdominal tenderness.     Hernia: No hernia is present.  Musculoskeletal:        General: No tenderness.     Cervical back: Neck supple.     Right lower leg: No edema.     Left lower leg: No edema.  Skin:    Findings: No rash.  Neurological:     Mental Status: She is alert and oriented to person, place, and time.     Motor: No weakness.  Psychiatric:        Mood and Affect: Mood and affect normal.        Behavior: Behavior normal.    BP (!) 147/93   Pulse 74   Ht '5\' 7"'$  (1.702 m)   Wt 237 lb 12.8 oz (107.9 kg)   LMP 01/19/2016 (Exact Date)   BMI 37.24 kg/m  Wt Readings from Last 3 Encounters:  04/19/21 237 lb 12.8 oz (107.9 kg)  05/20/20 232 lb (105.2 kg)  04/20/20 237 lb 3.2 oz (107.6 kg)     Health Maintenance Due  Topic Date Due   PNEUMOCOCCAL POLYSACCHARIDE VACCINE AGE 72-64 HIGH RISK  Never done   HIV Screening  Never done   TETANUS/TDAP  Never done   COLONOSCOPY (Pts 45-52yr Insurance coverage will need to be confirmed)  Never done   Zoster Vaccines- Shingrix (1 of 2) Never done   FOOT EXAM  05/03/2017   OPHTHALMOLOGY EXAM  05/31/2017   MAMMOGRAM  06/16/2018   COVID-19 Vaccine (3 - Booster for Pf[employer] series) 05/25/2020   INFLUENZA VACCINE  03/21/2021    There are no preventive care reminders to display for this patient.  Lab Results  Component Value Date   TSH 0.41 07/25/2017   Lab Results  Component Value Date   WBC 4.6 04/13/2020   HGB 16.1 (H) 04/13/2020   HCT 49.5 (H) 04/13/2020   MCV 96.5  04/13/2020   PLT 149 04/13/2020   Lab Results  Component Value Date   NA 132 (L) 04/13/2020   K 5.3 04/13/2020   CO2 16 (L) 04/13/2020   GLUCOSE 226 (H) 04/13/2020   BUN 20 04/13/2020   CREATININE 0.70 04/13/2020   BILITOT 0.4 04/13/2020   ALKPHOS 90 07/25/2017   AST  51 (H) 04/13/2020   ALT 80 (H) 04/13/2020   PROT 7.2 04/13/2020   ALBUMIN 4.0 07/25/2017   CALCIUM 9.4 04/13/2020   ANIONGAP 14 03/02/2016   GFR 100.66 07/25/2017   Lab Results  Component Value Date   CHOL 152 07/25/2017   Lab Results  Component Value Date   HDL 53.30 07/25/2017   Lab Results  Component Value Date   LDLCALC 71 07/25/2017   Lab Results  Component Value Date   TRIG 139.0 07/25/2017   Lab Results  Component Value Date   CHOLHDL 3 07/25/2017   Lab Results  Component Value Date   HGBA1C 8.6 04/19/2021      Assessment & Plan:   Problem List Items Addressed This Visit       Cardiovascular and Mediastinum   Essential hypertension     Patient denies any chest pain or shortness of breath there is no history of palpitation or paroxysmal nocturnal dyspnea   patient was advised to follow low-salt low-cholesterol diet    ideally I want to keep systolic blood pressure below 130 mmHg, patient was asked to check blood pressure one times a week and give me a report on that.  Patient will be follow-up in 3 months  or earlier as needed, patient will call me back for any change in the cardiovascular symptoms Patient was advised to buy a book from local bookstore concerning blood pressure and read several chapters  every day.  This will be supplemented by some of the material we will give him from the office.  Patient should also utilize other resources like YouTube and Internet to learn more about the blood pressure and the diet.      Relevant Medications   lisinopril (ZESTRIL) 10 MG tablet   rosuvastatin (CRESTOR) 20 MG tablet   Other Relevant Orders   TSH     Endocrine   Type 2 diabetes  mellitus without complication, without long-term current use of insulin ([place])    - The patient's blood sugar is labile on med. - The patient will continue the current treatment regimen.  - I encouraged the patient to regularly check blood sugar.  - I encouraged the patient to monitor diet. I encouraged the patient to eat low-carb and low-sugar to help prevent blood sugar spikes.  - I encouraged the patient to continue following their prescribed treatment plan for diabetes - I informed the patient to get help if blood sugar drops below '54mg'$ /dL, or if suddenly have trouble thinking clearly or breathing.  Patient was advised to buy a book on diabetes from a local bookstore or from [place]  Patient should read 2 chapters every day to keep the motivation going, this is in addition to some of the materials we provided them from the office.  There are other resources on the Internet like YouTube and wilkipedia to get an education on the diabetes      Relevant Medications   glimepiride (AMARYL) 4 MG tablet   lisinopril (ZESTRIL) 10 MG tablet   rosuvastatin (CRESTOR) 20 MG tablet   Other Relevant Orders   POCT HgB A1C (Completed)   POCT glucose (manual entry) (Completed)     Other   Hyperlipidemia - Primary    Hypercholesterolemia  I advised the patient to follow Mediterranean diet This diet is rich in fruits vegetables and whole grain, and This diet is also rich in fish and lean meat Patient should also eat a handful of almonds or walnuts daily Recent heart  study indicated that average follow-up on this kind of diet reduces the cardiovascular mortality by 50 to 70%==      Relevant Medications   lisinopril (ZESTRIL) 10 MG tablet   rosuvastatin (CRESTOR) 20 MG tablet   Other Relevant Orders   Lipid panel   Obesity (BMI 30.0-34.9)    - I encouraged the patient to lose weight.  - I educated them on making healthy dietary choices including eating more fruits and vegetables and less fried  foods. - I encouraged the patient to exercise more, and educated on the benefits of exercise including weight loss, diabetes prevention, and hypertension prevention.   Dietary counseling with a registered dietician  Referral to a weight management support group (e.g. Weight Watchers, Overeaters Anonymous)  If your BMI is greater than 29 or you have gained more than 15 pounds you should work on weight loss.  Attend a healthy cooking class       Heart murmur    Stable at the present time no chest pain or palpitation      Relevant Orders   CBC with Differential/Platelet   COMPLETE METABOLIC PANEL WITH GFR    Meds ordered this encounter  Medications   glimepiride (AMARYL) 4 MG tablet    Sig: Take 1 tablet (4 mg total) by mouth daily before breakfast.    Dispense:  90 tablet    Refill:  3   lisinopril (ZESTRIL) 10 MG tablet    Sig: Take 2 tablets (20 mg total) by mouth daily.    Dispense:  180 tablet    Refill:  3   rosuvastatin (CRESTOR) 20 MG tablet    Sig: Take 1 tablet (20 mg total) by mouth daily.    Dispense:  90 tablet    Refill:  3    Follow-up: No follow-ups on file.    Cletis Athens, MD

## 2021-04-19 NOTE — Assessment & Plan Note (Signed)

## 2021-04-19 NOTE — Assessment & Plan Note (Signed)

## 2021-04-19 NOTE — Assessment & Plan Note (Signed)
Hypercholesterolemia  I advised the patient to follow Mediterranean diet This diet is rich in fruits vegetables and whole grain, and This diet is also rich in fish and lean meat Patient should also eat a handful of almonds or walnuts daily Recent heart study indicated that average follow-up on this kind of diet reduces the cardiovascular mortality by 50 to 70%== 

## 2021-04-19 NOTE — Assessment & Plan Note (Signed)

## 2021-04-19 NOTE — Assessment & Plan Note (Signed)
Stable at the present time no chest pain or palpitation

## 2021-04-20 LAB — CBC WITH DIFFERENTIAL/PLATELET
Absolute Monocytes: 405 {cells}/uL (ref 200–950)
Basophils Absolute: 20 {cells}/uL (ref 0–200)
Basophils Relative: 0.4 %
Eosinophils Absolute: 50 {cells}/uL (ref 15–500)
Eosinophils Relative: 1 %
HCT: 44.5 % (ref 35.0–45.0)
Hemoglobin: 15.2 g/dL (ref 11.7–15.5)
Lymphs Abs: 2085 {cells}/uL (ref 850–3900)
MCH: 32.6 pg (ref 27.0–33.0)
MCHC: 34.2 g/dL (ref 32.0–36.0)
MCV: 95.5 fL (ref 80.0–100.0)
MPV: 11.4 fL (ref 7.5–12.5)
Monocytes Relative: 8.1 %
Neutro Abs: 2440 {cells}/uL (ref 1500–7800)
Neutrophils Relative %: 48.8 %
Platelets: 162 10*3/uL (ref 140–400)
RBC: 4.66 Million/uL (ref 3.80–5.10)
RDW: 12.2 % (ref 11.0–15.0)
Total Lymphocyte: 41.7 %
WBC: 5 10*3/uL (ref 3.8–10.8)

## 2021-04-20 LAB — LIPID PANEL
Cholesterol: 140 mg/dL (ref <200)
HDL: 47 mg/dL — ABNORMAL LOW (ref >=50)
LDL Cholesterol (Calc): 69 mg/dL (calc)
Non-HDL Cholesterol (Calc): 93 mg/dL (calc) (ref <130)
Total CHOL/HDL Ratio: 3 (calc) (ref <5.0)
Triglycerides: 163 mg/dL — ABNORMAL HIGH (ref <150)

## 2021-04-20 LAB — COMPLETE METABOLIC PANEL WITHOUT GFR
AG Ratio: 1.3 (calc) (ref 1.0–2.5)
ALT: 36 U/L — ABNORMAL HIGH (ref 6–29)
AST: 34 U/L (ref 10–35)
Albumin: 4 g/dL (ref 3.6–5.1)
Alkaline phosphatase (APISO): 72 U/L (ref 37–153)
BUN: 16 mg/dL (ref 7–25)
CO2: 22 mmol/L (ref 20–32)
Calcium: 9.6 mg/dL (ref 8.6–10.4)
Chloride: 101 mmol/L (ref 98–110)
Creat: 0.66 mg/dL (ref 0.50–1.03)
Globulin: 3.1 g/dL (ref 1.9–3.7)
Glucose, Bld: 229 mg/dL — ABNORMAL HIGH (ref 65–99)
Potassium: 5.1 mmol/L (ref 3.5–5.3)
Sodium: 133 mmol/L — ABNORMAL LOW (ref 135–146)
Total Bilirubin: 0.6 mg/dL (ref 0.2–1.2)
Total Protein: 7.1 g/dL (ref 6.1–8.1)
eGFR: 102 mL/min/{1.73_m2}

## 2021-04-20 LAB — TSH: TSH: 0.46 m[IU]/L (ref 0.40–4.50)

## 2021-05-03 ENCOUNTER — Encounter: Payer: Self-pay | Admitting: Internal Medicine

## 2021-05-03 ENCOUNTER — Other Ambulatory Visit: Payer: Self-pay

## 2021-05-03 ENCOUNTER — Ambulatory Visit: Payer: BC Managed Care – PPO | Admitting: Internal Medicine

## 2021-05-03 VITALS — BP 116/72 | HR 89 | Ht 67.0 in | Wt 239.4 lb

## 2021-05-03 DIAGNOSIS — E119 Type 2 diabetes mellitus without complications: Secondary | ICD-10-CM | POA: Diagnosis not present

## 2021-05-03 DIAGNOSIS — H0260 Xanthelasma of unspecified eye, unspecified eyelid: Secondary | ICD-10-CM

## 2021-05-03 DIAGNOSIS — I10 Essential (primary) hypertension: Secondary | ICD-10-CM | POA: Diagnosis not present

## 2021-05-03 DIAGNOSIS — E785 Hyperlipidemia, unspecified: Secondary | ICD-10-CM | POA: Diagnosis not present

## 2021-05-03 DIAGNOSIS — E669 Obesity, unspecified: Secondary | ICD-10-CM

## 2021-05-03 MED ORDER — SITAGLIPTIN PHOSPHATE 100 MG PO TABS
100.0000 mg | ORAL_TABLET | Freq: Every day | ORAL | 6 refills | Status: DC
Start: 2021-05-03 — End: 2022-07-20

## 2021-05-03 NOTE — Assessment & Plan Note (Signed)

## 2021-05-03 NOTE — Progress Notes (Signed)
Established Patient Office Visit  Subjective:  Patient ID: Kimberly Rowe, female    DOB: Mar 29, 1961  Age: 60 y.o. MRN: [id number]  CC:  Chief Complaint  Patient presents with   Follow-up    2 week follow up    HPI  Kimberly Rowe presents for check up  Past Medical History:  Diagnosis Date   Chicken pox    age 67   Diabetes mellitus without complication ([place])    Heart murmur    Hypertension    Pulmonary embolism ([place])    After surgery for ovarian tumor   Rosacea    SIRS (systemic inflammatory response syndrome) ([place])    resolved. 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   BREAST BIOPSY Left 07/04/2016   Stereo- path unknown   TONSILLECTOMY      Family History  Problem Relation Age of Onset   Prostate cancer Father    Arthritis Maternal Grandfather    Breast cancer Neg Hx     Social History   Socioeconomic History   Marital status: Married    Spouse name: Not on file   Number of children: Not on file   Years of education: Not on file   Highest education level: Not on file  Occupational History   Not on file  Tobacco Use   Smoking status: Never   Smokeless tobacco: Never  Substance and Sexual Activity   Alcohol use: Yes    Comment: occ   Drug use: No   Sexual activity: Yes    Partners: Male  Other Topics Concern   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    Social Determinants of Health   Financial Resource Strain: Not on file  Food Insecurity: Not on file  Transportation Needs: Not on file  Physical Activity: Not on file  Stress: Not on file  Social Connections: Not on file  Intimate Partner Violence: Not on file      Current Outpatient Medications:    glimepiride (AMARYL) 4 MG tablet, Take 1 tablet (4 mg total) by mouth daily before breakfast., Disp: 90 tablet, Rfl: 3   lisinopril (ZESTRIL) 10 MG tablet, Take 2 tablets (20 mg total) by mouth daily., Disp: 180 tablet, Rfl: 3   metFORMIN (GLUCOPHAGE-XR) 500 MG 24 hr tablet, Take 1 tablet (500 mg total) by mouth 2 (two) times daily., Disp: 180 tablet, Rfl: 3   rosuvastatin (CRESTOR) 20 MG tablet, Take 1 tablet (20 mg total) by mouth daily., Disp: 90 tablet, Rfl: 3   No Known Allergies  ROS Review of Systems  Constitutional: Negative.   HENT: Negative.    Eyes: Negative.   Respiratory: Negative.    Cardiovascular: Negative.   Gastrointestinal: Negative.   Endocrine: Negative.   Genitourinary: Negative.   Musculoskeletal: Negative.   Skin: Negative.   Allergic/Immunologic: Negative.   Neurological: Negative.   Hematological: Negative.   Psychiatric/Behavioral: Negative.    All other systems reviewed and are negative.    Objective:    Physical Exam Vitals reviewed.  Constitutional:      Appearance: Normal appearance.  HENT:  Mouth/Throat:     Mouth: Mucous membranes are moist.  Eyes:     Pupils: Pupils are equal, round, and reactive to light.  Neck:     Vascular: No carotid bruit.  Cardiovascular:     Rate and Rhythm: Normal rate and regular rhythm.     Pulses: Normal pulses.     Heart sounds: Normal heart sounds.  Pulmonary:     Effort: Pulmonary effort is normal.     Breath sounds: Normal breath sounds.  Abdominal:     General: Bowel sounds are normal.     Palpations: Abdomen is soft. There is no hepatomegaly, splenomegaly or mass.     Tenderness: There is no abdominal tenderness.     Hernia: No hernia is present.  Musculoskeletal:        General: No tenderness.     Cervical back: Neck supple.     Right lower leg: No edema.     Left lower leg: No edema.  Skin:    Findings: No rash.  Neurological:     Mental  Status: She is alert and oriented to person, place, and time.     Motor: No weakness.  Psychiatric:        Mood and Affect: Mood and affect normal.        Behavior: Behavior normal.    BP 116/72   Pulse 89   Ht _0  (1.702 m)   Wt 239 lb 6.4 oz (108.6 kg)   LMP 01/19/2016 (Exact Date)   BMI 37.50 kg/m  Wt Readings from Last 3 Encounters:  05/03/21 239 lb 6.4 oz (108.6 kg)  04/19/21 237 lb 12.8 oz (107.9 kg)  05/20/20 232 lb (105.2 kg)     Health Maintenance Due  Topic Date Due   PNEUMOCOCCAL POLYSACCHARIDE VACCINE AGE 35-64 HIGH RISK  Never done   HIV Screening  Never done   TETANUS/TDAP  Never done   COLONOSCOPY (Pts 45-23yr Insurance coverage will need to be confirmed)  Never done   Zoster Vaccines- Shingrix (1 of 2) Never done   FOOT EXAM  05/03/2017   OPHTHALMOLOGY EXAM  05/31/2017   MAMMOGRAM  06/16/2018   COVID-19 Vaccine (3 - Booster for [place]) 05/25/2020   INFLUENZA VACCINE  Never done    There are no preventive care reminders to display for this patient.  Lab Results  Component Value Date   TSH 0.46 04/19/2021   Lab Results  Component Value Date   WBC 5.0 04/19/2021   HGB 15.2 04/19/2021   HCT 44.5 04/19/2021   MCV 95.5 04/19/2021   PLT 162 04/19/2021   Lab Results  Component Value Date   NA 133 (L) 04/19/2021   K 5.1 04/19/2021   CO2 22 04/19/2021   GLUCOSE 229 (H) 04/19/2021   BUN 16 04/19/2021   CREATININE 0.66 04/19/2021   BILITOT 0.6 04/19/2021   ALKPHOS 90 07/25/2017   AST 34 04/19/2021   ALT 36 (H) 04/19/2021   PROT 7.1 04/19/2021   ALBUMIN 4.0 07/25/2017   CALCIUM 9.6 04/19/2021   ANIONGAP 14 03/02/2016   EGFR 102 04/19/2021   GFR 100.66 07/25/2017   Lab Results  Component Value Date   CHOL 140 04/19/2021   Lab Results  Component Value Date   HDL 47 (L) 04/19/2021   Lab Results  Component Value Date   LDLCALC 69 04/19/2021     Assessment & Plan:   Problem List Items Addressed This Visit  Cardiovascular and Mediastinum   Essential hypertension     Patient denies any chest pain or shortness of breath there is no history of palpitation or paroxysmal nocturnal dyspnea   patient was advised to follow low-salt low-cholesterol diet    ideally I want to keep systolic blood pressure below 130 mmHg, patient was asked to check blood pressure one times a week and give me a report on that.  Patient will be follow-up in 3 months  or earlier as needed, patient will call me back for any change in the cardiovascular symptoms Patient was advised to buy a book from local bookstore concerning blood pressure and read several chapters  every day.  This will be supplemented by some of the material we will give him from the office.  Patient should also utilize other resources like YouTube and Internet to learn more about the blood pressure and the diet.        Endocrine   Type 2 diabetes mellitus without complication, without long-term current use of insulin ([place]) - Primary    - The patient's blood sugar is labile on med. - The patient will continue the current treatment regimen.  - I encouraged the patient to regularly check blood sugar.  - I encouraged the patient to monitor diet. I encouraged the patient to eat low-carb and low-sugar to help prevent blood sugar spikes.  - I encouraged the patient to continue following their prescribed treatment plan for diabetes - I informed the patient to get help if blood sugar drops below 64m/dL, or if suddenly have trouble thinking clearly or breathing.  Patient was advised to buy a book on diabetes from a local bookstore or from [place]  Patient should read 2 chapters every day to keep the motivation going, this is in addition to some of the materials we provided them from the office.  There are other resources on the Internet like YouTube and wilkipedia to get an education on the diabetes        Musculoskeletal and Integument   Xanthelasma    Patient is on  statin        Other   Hyperlipidemia    Hypercholesterolemia  I advised the patient to follow Mediterranean diet This diet is rich in fruits vegetables and whole grain, and This diet is also rich in fish and lean meat Patient should also eat a handful of almonds or walnuts daily Recent heart study indicated that average follow-up on this kind of diet reduces the cardiovascular mortality by 50 to 70%==      Obesity (BMI 30.0-34.9)    Behavioral modification strategies: increasing lean protein intake, decreasing simple carbohydrates, increasing vegetables, increasing water intake, decreasing eating out, no skipping meals, meal planning and cooking strategies, keeping healthy foods in the home and planning for success.       No orders of the defined types were placed in this encounter.   Follow-up: No follow-ups on file.    JCletis Athens MD

## 2021-05-03 NOTE — Assessment & Plan Note (Signed)

## 2021-05-03 NOTE — Assessment & Plan Note (Signed)
Patient is on statin.

## 2021-05-03 NOTE — Assessment & Plan Note (Signed)
Behavioral modification strategies: increasing lean protein intake, decreasing simple carbohydrates, increasing vegetables, increasing water intake, decreasing eating out, no skipping meals, meal planning and cooking strategies, keeping healthy foods in the home and planning for success. 

## 2021-05-03 NOTE — Assessment & Plan Note (Signed)
Hypercholesterolemia  I advised the patient to follow Mediterranean diet This diet is rich in fruits vegetables and whole grain, and This diet is also rich in fish and lean meat Patient should also eat a handful of almonds or walnuts daily Recent heart study indicated that average follow-up on this kind of diet reduces the cardiovascular mortality by 50 to 70%== 

## 2021-06-07 ENCOUNTER — Ambulatory Visit: Payer: BC Managed Care – PPO | Admitting: Internal Medicine

## 2021-06-07 ENCOUNTER — Encounter: Payer: Self-pay | Admitting: Internal Medicine

## 2021-06-07 ENCOUNTER — Other Ambulatory Visit: Payer: Self-pay

## 2021-06-07 VITALS — BP 120/76 | HR 85 | Ht 67.0 in | Wt 239.4 lb

## 2021-06-07 DIAGNOSIS — E119 Type 2 diabetes mellitus without complications: Secondary | ICD-10-CM | POA: Diagnosis not present

## 2021-06-07 DIAGNOSIS — E785 Hyperlipidemia, unspecified: Secondary | ICD-10-CM

## 2021-06-07 DIAGNOSIS — I10 Essential (primary) hypertension: Secondary | ICD-10-CM | POA: Diagnosis not present

## 2021-06-07 DIAGNOSIS — E669 Obesity, unspecified: Secondary | ICD-10-CM

## 2021-06-07 NOTE — Assessment & Plan Note (Signed)
Behavioral modification strategies: increasing lean protein intake, decreasing simple carbohydrates, increasing vegetables, increasing water intake, decreasing eating out, no skipping meals, meal planning and cooking strategies, keeping healthy foods in the home and planning for success. 

## 2021-06-07 NOTE — Assessment & Plan Note (Signed)

## 2021-06-07 NOTE — Progress Notes (Signed)
Established Patient Office Visit  Subjective:  Patient ID: Kimberly Rowe, female    DOB: September 13, 1960  Age: 60 y.o. MRN: [id number]  CC:  Chief Complaint  Patient presents with   Diabetes    BS was 158 today     Diabetes   Kimberly Rowe presents for general check up  Past Medical History:  Diagnosis Date   Chicken pox    age 68   Diabetes mellitus without complication ([place])    Heart murmur    Hypertension    Pulmonary embolism ([place])    After surgery for ovarian tumor   Rosacea    SIRS (systemic inflammatory response syndrome) ([place])    resolved. 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   BREAST BIOPSY Left 07/04/2016   Stereo- path unknown   TONSILLECTOMY      Family History  Problem Relation Age of Onset   Prostate cancer Father    Arthritis Maternal Grandfather    Breast cancer Neg Hx     Social History   Socioeconomic History   Marital status: Married    Spouse name: Not on file   Number of children: Not on file   Years of education: Not on file   Highest education level: Not on file  Occupational History   Not on file  Tobacco Use   Smoking status: Never   Smokeless tobacco: Never  Substance and Sexual Activity   Alcohol use: Yes    Comment: occ   Drug use: No   Sexual activity: Yes    Partners: Male  Other Topics Concern   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    Social Determinants of Health   Financial Resource Strain: Not on file  Food Insecurity: Not on file  Transportation Needs: Not on file  Physical Activity: Not on file  Stress: Not on file  Social Connections: Not on file  Intimate Partner Violence: Not on  file     Current Outpatient Medications:    glimepiride (AMARYL) 4 MG tablet, Take 1 tablet (4 mg total) by mouth daily before breakfast., Disp: 90 tablet, Rfl: 3   lisinopril (ZESTRIL) 10 MG tablet, Take 2 tablets (20 mg total) by mouth daily., Disp: 180 tablet, Rfl: 3   metFORMIN (GLUCOPHAGE-XR) 500 MG 24 hr tablet, Take 1 tablet (500 mg total) by mouth 2 (two) times daily., Disp: 180 tablet, Rfl: 3   rosuvastatin (CRESTOR) 20 MG tablet, Take 1 tablet (20 mg total) by mouth daily., Disp: 90 tablet, Rfl: 3   sitaGLIPtin (JANUVIA) 100 MG tablet, Take 1 tablet (100 mg total) by mouth daily., Disp: 30 tablet, Rfl: 6   No Known Allergies  ROS Review of Systems  Constitutional: Negative.   HENT: Negative.    Eyes: Negative.   Respiratory: Negative.    Cardiovascular: Negative.   Gastrointestinal: Negative.   Endocrine: Negative.   Genitourinary: Negative.   Musculoskeletal: Negative.   Skin: Negative.   Allergic/Immunologic: Negative.   Neurological: Negative.   Hematological: Negative.   Psychiatric/Behavioral: Negative.    All other systems reviewed and are negative.  Objective:    Physical Exam Vitals reviewed.  Constitutional:      Appearance: Normal appearance.  HENT:     Mouth/Throat:     Mouth: Mucous membranes are moist.  Eyes:     Pupils: Pupils are equal, round, and reactive to light.  Neck:     Vascular: No carotid bruit.  Cardiovascular:     Rate and Rhythm: Normal rate and regular rhythm.     Pulses: Normal pulses.     Heart sounds: Normal heart sounds.  Pulmonary:     Effort: Pulmonary effort is normal.     Breath sounds: Normal breath sounds.  Abdominal:     General: Bowel sounds are normal.     Palpations: Abdomen is soft. There is no hepatomegaly, splenomegaly or mass.     Tenderness: There is no abdominal tenderness.     Hernia: No hernia is present.  Musculoskeletal:        General: No tenderness.     Cervical back: Neck supple.     Right  lower leg: No edema.     Left lower leg: No edema.  Skin:    Findings: No rash.  Neurological:     Mental Status: She is alert and oriented to person, place, and time.     Motor: No weakness.  Psychiatric:        Mood and Affect: Mood and affect normal.        Behavior: Behavior normal.    BP 120/76   Pulse 85   Ht 5' 7"  (1.702 m)   Wt 239 lb 6.4 oz (108.6 kg)   LMP 01/19/2016 (Exact Date)   BMI 37.50 kg/m  Wt Readings from Last 3 Encounters:  06/07/21 239 lb 6.4 oz (108.6 kg)  05/03/21 239 lb 6.4 oz (108.6 kg)  04/19/21 237 lb 12.8 oz (107.9 kg)     Health Maintenance Due  Topic Date Due   HIV Screening  Never done   TETANUS/TDAP  Never done   COLONOSCOPY (Pts 45-38yr Insurance coverage will need to be confirmed)  Never done   Zoster Vaccines- Shingrix (1 of 2) Never done   FOOT EXAM  05/03/2017   OPHTHALMOLOGY EXAM  05/31/2017   MAMMOGRAM  06/16/2018   COVID-19 Vaccine (3 - Booster for [employer]) 05/25/2020   INFLUENZA VACCINE  Never done    There are no preventive care reminders to display for this patient.  Lab Results  Component Value Date   TSH 0.46 04/19/2021   Lab Results  Component Value Date   WBC 5.0 04/19/2021   HGB 15.2 04/19/2021   HCT 44.5 04/19/2021   MCV 95.5 04/19/2021   PLT 162 04/19/2021   Lab Results  Component Value Date   NA 133 (L) 04/19/2021   K 5.1 04/19/2021   CO2 22 04/19/2021   GLUCOSE 229 (H) 04/19/2021   BUN 16 04/19/2021   CREATININE 0.66 04/19/2021   BILITOT 0.6 04/19/2021   ALKPHOS 90 07/25/2017   AST 34 04/19/2021   ALT 36 (H) 04/19/2021   PROT 7.1 04/19/2021   ALBUMIN 4.0 07/25/2017   CALCIUM 9.6 04/19/2021   ANIONGAP 14 03/02/2016   EGFR 102 04/19/2021   GFR 100.66 07/25/2017   Lab Results  Component Value Date   CHOL 140 04/19/2021   Lab Results  Component Value Date   HDL 47 (L) 04/19/2021   Lab Results  Component Value Date   LDLCALC 69 04/19/2021   Lab Results  Component Value  Date    TRIG 163 (H) 04/19/2021   Lab Results  Component Value Date   CHOLHDL 3.0 04/19/2021   Lab Results  Component Value Date   HGBA1C 8.6 04/19/2021      Assessment & Plan:   Problem List Items Addressed This Visit       Cardiovascular and Mediastinum   Essential hypertension - Primary    The following hypertensive lifestyle modification were recommended and discussed:  1. Limiting alcohol intake to less than 1 oz/day of ethanol:(24 oz of beer or 8 oz of wine or 2 oz of 100-proof whiskey). 2. Take baby ASA 81 mg daily. 3. Importance of regular aerobic exercise and losing weight. 4. Reduce dietary saturated fat and cholesterol intake for overall cardiovascular health. 5. Maintaining adequate dietary potassium, calcium, and magnesium intake. 6. Regular monitoring of the blood pressure. 7. Reduce sodium intake to less than 100 mmol/day (less than 2.3 gm of sodium or less than 6 gm of sodium choride)         Endocrine   Type 2 diabetes mellitus without complication, without long-term current use of insulin ([place])    - The patient's blood sugar is labile on med. - The patient will continue the current treatment regimen.  - I encouraged the patient to regularly check blood sugar.  - I encouraged the patient to monitor diet. I encouraged the patient to eat low-carb and low-sugar to help prevent blood sugar spikes.  - I encouraged the patient to continue following their prescribed treatment plan for diabetes - I informed the patient to get help if blood sugar drops below 83m/dL, or if suddenly have trouble thinking clearly or breathing.  Patient was advised to buy a book on diabetes from a local bookstore or from [place]  Patient should read 2 chapters every day to keep the motivation going, this is in addition to some of the materials we provided them from the office.  There are other resources on the Internet like YouTube and wilkipedia to get an education on the diabetes        Other    Hyperlipidemia    Hypercholesterolemia  I advised the patient to follow Mediterranean diet This diet is rich in fruits vegetables and whole grain, and This diet is also rich in fish and lean meat Patient should also eat a handful of almonds or walnuts daily Recent heart study indicated that average follow-up on this kind of diet reduces the cardiovascular mortality by 50 to 70%==      Obesity (BMI 30.0-34.9)    Behavioral modification strategies: increasing lean protein intake, decreasing simple carbohydrates, increasing vegetables, increasing water intake, decreasing eating out, no skipping meals, meal planning and cooking strategies, keeping healthy foods in the home and planning for success.       No orders of the defined types were placed in this encounter.   Follow-up: No follow-ups on file.    JCletis Athens MD

## 2021-06-07 NOTE — Assessment & Plan Note (Signed)

## 2021-06-07 NOTE — Assessment & Plan Note (Signed)
Hypercholesterolemia  I advised the patient to follow Mediterranean diet This diet is rich in fruits vegetables and whole grain, and This diet is also rich in fish and lean meat Patient should also eat a handful of almonds or walnuts daily Recent heart study indicated that average follow-up on this kind of diet reduces the cardiovascular mortality by 50 to 70%== 

## 2021-06-23 ENCOUNTER — Other Ambulatory Visit: Payer: Self-pay | Admitting: Internal Medicine

## 2021-08-25 ENCOUNTER — Other Ambulatory Visit: Payer: Self-pay | Admitting: Internal Medicine

## 2021-11-07 ENCOUNTER — Other Ambulatory Visit: Payer: Self-pay | Admitting: *Deleted

## 2021-11-07 MED ORDER — METFORMIN HCL ER 500 MG PO TB24: 500.0000 mg | ORAL_TABLET | Freq: Two times a day (BID) | ORAL | 3 refills | Status: DC

## 2022-04-29 ENCOUNTER — Other Ambulatory Visit: Payer: Self-pay | Admitting: *Deleted

## 2022-04-29 DIAGNOSIS — Z1231 Encounter for screening mammogram for malignant neoplasm of breast: Secondary | ICD-10-CM

## 2022-05-01 ENCOUNTER — Other Ambulatory Visit: Payer: Self-pay | Admitting: Internal Medicine

## 2022-05-01 DIAGNOSIS — E782 Mixed hyperlipidemia: Secondary | ICD-10-CM

## 2022-05-18 ENCOUNTER — Other Ambulatory Visit: Payer: Self-pay | Admitting: Internal Medicine

## 2022-05-18 DIAGNOSIS — E119 Type 2 diabetes mellitus without complications: Secondary | ICD-10-CM

## 2022-06-08 ENCOUNTER — Other Ambulatory Visit: Payer: Self-pay | Admitting: Internal Medicine

## 2022-06-08 DIAGNOSIS — E782 Mixed hyperlipidemia: Secondary | ICD-10-CM

## 2022-06-22 ENCOUNTER — Other Ambulatory Visit: Payer: Self-pay | Admitting: Internal Medicine

## 2022-06-22 DIAGNOSIS — E119 Type 2 diabetes mellitus without complications: Secondary | ICD-10-CM

## 2022-06-22 DIAGNOSIS — I10 Essential (primary) hypertension: Secondary | ICD-10-CM

## 2022-07-06 ENCOUNTER — Ambulatory Visit: Payer: BC Managed Care – PPO | Admitting: Nurse Practitioner

## 2022-07-06 ENCOUNTER — Encounter: Payer: Self-pay | Admitting: Nurse Practitioner

## 2022-07-06 VITALS — BP 159/91 | HR 90 | Temp 97.1°F | Wt 233.4 lb

## 2022-07-06 DIAGNOSIS — E119 Type 2 diabetes mellitus without complications: Secondary | ICD-10-CM | POA: Diagnosis not present

## 2022-07-06 DIAGNOSIS — I10 Essential (primary) hypertension: Secondary | ICD-10-CM | POA: Diagnosis not present

## 2022-07-06 DIAGNOSIS — E785 Hyperlipidemia, unspecified: Secondary | ICD-10-CM | POA: Diagnosis not present

## 2022-07-06 DIAGNOSIS — E66811 Obesity, class 1: Secondary | ICD-10-CM

## 2022-07-06 DIAGNOSIS — E669 Obesity, unspecified: Secondary | ICD-10-CM | POA: Diagnosis not present

## 2022-07-06 LAB — POCT GLYCOSYLATED HEMOGLOBIN (HGB A1C): Hemoglobin A1C: 10.1 % — AB (ref 4.0–5.6)

## 2022-07-06 MED ORDER — DAPAGLIFLOZIN PROPANEDIOL 10 MG PO TABS: 10.0000 mg | ORAL_TABLET | Freq: Every day | ORAL | 0 refills | Status: DC

## 2022-07-06 NOTE — Assessment & Plan Note (Addendum)
Patient blood pressure elevated in the office 172/89 and 152/91. She is currently taking lisinopril 20 mg daily. Encourage patient to consume low-salt and heart healthy diet. Encouraged her to check blood pressure at home and bring the readings to next appointment. Increased lisinopril to 30 mg daily. We will continue to monitor. Labs ordered.

## 2022-07-06 NOTE — Progress Notes (Signed)
Established Patient Office Visit  Subjective:  Patient ID: Kimberly Rowe, female    DOB: [birth year]  Age: 61 y.o. MRN: [id number]  CC:  Chief Complaint  Patient presents with   Diabetes    Thn said she needed her urine and A1c checked, behind on DM follow up. States that the [place] was bothering her eyes, like her vision was fuzzy. She stopped it and her vision is fine.      HPI  Kimberly Rowe presents for routine follow up. She has history of hypertension, hyperlipdemia and diabetes.  Her previous hemoglobin A1c on 04/19/2021 was 8.6.  She states that she is not taking Januvia because of side effect and is only taking metformin and glimepiride.  She also complaint off and on  pain in the right shoulder blade going on from 3-4 days. She did not take anything for it. She rate her pain 4/10   HPI   Past Medical History:  Diagnosis Date   Chicken pox    age 50   Diabetes mellitus without complication ([place])    Heart murmur    Hypertension    Pulmonary embolism ([place])    After surgery for ovarian tumor   Rosacea    SIRS (systemic inflammatory response syndrome) ([place])    resolved. 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   BREAST BIOPSY Left 07/04/2016   Stereo- path unknown   TONSILLECTOMY      Family History  Problem Relation Age of Onset   Prostate cancer Father    Arthritis Maternal Grandfather    Breast cancer Neg Hx     Social History   Socioeconomic History   Marital status: Married    Spouse name: Not on file   Number of children: Not on file   Years of education: Not on file   Highest education level: Not on file  Occupational History   Not on file  Tobacco Use   Smoking status: Never   Smokeless tobacco: Never   Substance and Sexual Activity   Alcohol use: Yes    Comment: occ   Drug use: No   Sexual activity: Yes    Partners: Male  Other Topics Concern   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    Social Determinants of Health   Financial Resource Strain: Not on file  Food Insecurity: Not on file  Transportation Needs: Not on file  Physical Activity: Not on file  Stress: Not on file  Social Connections: Not on file  Intimate Partner Violence: Not on file     Outpatient Medications Prior to Visit  Medication Sig Dispense Refill   glimepiride (AMARYL) 4 MG tablet TAKE 1 TABLET BY MOUTH ONCE DAILY BEFORE BREAKFAST 30 tablet 0   lisinopril (ZESTRIL) 10 MG tablet Take 2 tablets by mouth once daily 60 tablet 0   metFORMIN (GLUCOPHAGE-XR) 500 MG 24 hr tablet Take 1 tablet by mouth twice daily 180 tablet 0   rosuvastatin (CRESTOR) 20 MG tablet Take 1 tablet by mouth once daily 30 tablet 0   sitaGLIPtin (JANUVIA) 100 MG tablet Take 1  tablet (100 mg total) by mouth daily. (Patient not taking: Reported on 07/06/2022) 30 tablet 6   No facility-administered medications prior to visit.    No Known Allergies  ROS Review of Systems  Constitutional: Negative.   HENT: Negative.    Eyes: Negative.   Respiratory:  Negative for cough and shortness of breath.   Cardiovascular:  Negative for chest pain and palpitations.  Gastrointestinal: Negative.   Genitourinary: Negative.   Musculoskeletal:  Positive for back pain.  Skin: Negative.   Neurological:  Negative for dizziness, facial asymmetry and headaches.  Psychiatric/Behavioral:  Negative for agitation, behavioral problems and confusion.       Objective:    Physical Exam Constitutional:      Appearance: Normal appearance. She is obese.  HENT:     Head: Normocephalic.     Right Ear: Tympanic membrane normal.     Left Ear: Tympanic membrane normal.      Nose: Nose normal.     Mouth/Throat:     Mouth: Mucous membranes are moist.     Pharynx: Oropharynx is clear.  Eyes:     Extraocular Movements: Extraocular movements intact.     Conjunctiva/sclera: Conjunctivae normal.     Pupils: Pupils are equal, round, and reactive to light.  Cardiovascular:     Rate and Rhythm: Normal rate and regular rhythm.     Pulses: Normal pulses.     Heart sounds: Normal heart sounds.  Pulmonary:     Effort: Pulmonary effort is normal. No respiratory distress.     Breath sounds: Normal breath sounds. No rhonchi.  Abdominal:     General: Bowel sounds are normal.     Palpations: Abdomen is soft. There is no mass.     Tenderness: There is no abdominal tenderness.     Hernia: No hernia is present.  Musculoskeletal:        General: Normal range of motion.     Cervical back: Neck supple. No tenderness.  Skin:    General: Skin is warm.     Capillary Refill: Capillary refill takes less than 2 seconds.  Neurological:     General: No focal deficit present.     Mental Status: She is alert and oriented to person, place, and time. Mental status is at baseline.  Psychiatric:        Mood and Affect: Mood normal.        Behavior: Behavior normal.        Thought Content: Thought content normal.        Judgment: Judgment normal.     BP (!) 159/91   Pulse 90   Temp (!) 97.1 F (36.2 C) (Temporal)   Wt 233 lb 6.4 oz (105.9 kg)   LMP 01/19/2016 (Exact Date)   SpO2 95%   BMI 36.56 kg/m  Wt Readings from Last 3 Encounters:  07/06/22 233 lb 6.4 oz (105.9 kg)  06/07/21 239 lb 6.4 oz (108.6 kg)  05/03/21 239 lb 6.4 oz (108.6 kg)     Health Maintenance  Topic Date Due   HIV Screening  Never done   COLONOSCOPY (Pts 45-60yr Insurance coverage will need to be confirmed)  Never done   Zoster Vaccines- Shingrix (1 of 2) Never done   FOOT EXAM  05/03/2017   OPHTHALMOLOGY EXAM  05/31/2017   MAMMOGRAM  06/16/2018   COVID-19 Vaccine (3 - Pfizer series) 02/18/2020    HEMOGLOBIN A1C  01/04/2023   Diabetic kidney evaluation - GFR measurement  07/07/2023  Diabetic kidney evaluation - Urine ACR  07/07/2023   Hepatitis C Screening  Completed   HPV VACCINES  Aged Out   INFLUENZA VACCINE  Discontinued    There are no preventive care reminders to display for this patient.  Lab Results  Component Value Date   TSH 0.47 07/06/2022   Lab Results  Component Value Date   WBC 5.7 07/06/2022   HGB 15.3 07/06/2022   HCT 45.9 (H) 07/06/2022   MCV 94.3 07/06/2022   PLT 152 07/06/2022   Lab Results  Component Value Date   NA 139 07/06/2022   K 6.0 (H) 07/06/2022   CO2 27 07/06/2022   GLUCOSE 373 (H) 07/06/2022   BUN 21 07/06/2022   CREATININE 0.81 07/06/2022   BILITOT 0.5 07/06/2022   ALKPHOS 90 07/25/2017   AST 33 07/06/2022   ALT 58 (H) 07/06/2022   PROT 7.3 07/06/2022   ALBUMIN 4.0 07/25/2017   CALCIUM 10.3 07/06/2022   ANIONGAP 14 03/02/2016   EGFR 84 07/06/2022   GFR 100.66 07/25/2017   Lab Results  Component Value Date   CHOL 168 07/06/2022   Lab Results  Component Value Date   HDL 47 (L) 07/06/2022   Lab Results  Component Value Date   LDLCALC 87 07/06/2022   Lab Results  Component Value Date   TRIG 256 (H) 07/06/2022   Lab Results  Component Value Date   CHOLHDL 3.6 07/06/2022   Lab Results  Component Value Date   HGBA1C 10.1 (A) 07/06/2022      Assessment & Plan:   Problem List Items Addressed This Visit       Cardiovascular and Mediastinum   Essential hypertension    Patient blood pressure elevated in the office 172/89 and 152/91. She is currently taking lisinopril 20 mg daily. Encourage patient to consume low-salt and heart healthy diet. Encouraged her to check blood pressure at home and bring the readings to next appointment. Increased lisinopril to 30 mg daily. We will continue to monitor. Labs ordered.       Relevant Orders   CBC with Differential/Platelet (Completed)   COMPLETE METABOLIC PANEL  WITH GFR (Completed)   Lipid panel (Completed)   TSH (Completed)     Endocrine   Type 2 diabetes mellitus without complication, without long-term current use of insulin ([place]) - Primary    Encouraged her to check blood sugar at home and bring the numbers to next office visit. Encouraged her to watch diet and follow regular exercise routine. Continue metformin and glimepiride. Started her on Farxiga sample provided. We will check hemoglobin A1c and urine microalbumin.      Relevant Medications   dapagliflozin propanediol (FARXIGA) 10 MG TABS tablet   Other Relevant Orders   POCT glycosylated hemoglobin (Hb A1C) (Completed)   Microalbumin / creatinine urine ratio (Completed)     Other   Hyperlipidemia    She had elevated triglyceride levels  on 04/19/2021. Continue statin therapy for cardiovascular risk reduction. We will check the lipids level      Obesity (BMI 30.0-34.9)    Body mass index is 36.56 kg/m. Advised pt to lose weight. Advised patient to avoid trans fat, fatty and fried food. Follow a regular physical activity schedule. Went over the risk of chronic diseases with increased weight.            Meds ordered this encounter  Medications   dapagliflozin propanediol (FARXIGA) 10 MG TABS tablet    Sig: Take 1 tablet (10 mg  total) by mouth daily before breakfast.    Dispense:  7 tablet    Refill:  0    Order Specific Question:   Lot Number?    Answer:   [id number]    Order Specific Question:   Expiration Date?    Answer:   01/18/2025    Order Specific Question:   Quantity    Answer:   7     Follow-up: No follow-ups on file.    Theresia Lo, NP

## 2022-07-07 LAB — MICROALBUMIN / CREATININE URINE RATIO
Creatinine, Urine: 34 mg/dL (ref 20–275)
Microalb, Ur: <0.2 mg/dL

## 2022-07-09 ENCOUNTER — Encounter: Payer: Self-pay | Admitting: Nurse Practitioner

## 2022-07-09 LAB — COMPLETE METABOLIC PANEL WITH GFR
AG Ratio: 1.4 (calc) (ref 1.0–2.5)
ALT: 58 U/L — ABNORMAL HIGH (ref 6–29)
AST: 33 U/L (ref 10–35)
Albumin: 4.2 g/dL (ref 3.6–5.1)
Alkaline phosphatase (APISO): 128 U/L (ref 37–153)
BUN: 21 mg/dL (ref 7–25)
CO2: 27 mmol/L (ref 20–32)
Calcium: 10.3 mg/dL (ref 8.6–10.4)
Chloride: 103 mmol/L (ref 98–110)
Creat: 0.81 mg/dL (ref 0.50–1.03)
Globulin: 3.1 g/dL (calc) (ref 1.9–3.7)
Glucose, Bld: 373 mg/dL — ABNORMAL HIGH (ref 65–99)
Potassium: 6 mmol/L — ABNORMAL HIGH (ref 3.5–5.3)
Sodium: 139 mmol/L (ref 135–146)
Total Bilirubin: 0.5 mg/dL (ref 0.2–1.2)
Total Protein: 7.3 g/dL (ref 6.1–8.1)
eGFR: 84 mL/min/{1.73_m2} (ref >=60)

## 2022-07-09 LAB — CBC WITH DIFFERENTIAL/PLATELET
Absolute Monocytes: 519 cells/uL (ref 200–950)
Basophils Absolute: 17 cells/uL (ref 0–200)
Basophils Relative: 0.3 %
Eosinophils Absolute: 57 cells/uL (ref 15–500)
Eosinophils Relative: 1 %
HCT: 45.9 % — ABNORMAL HIGH (ref 35.0–45.0)
Hemoglobin: 15.3 g/dL (ref 11.7–15.5)
Lymphs Abs: 1533 cells/uL (ref 850–3900)
MCH: 31.4 pg (ref 27.0–33.0)
MCHC: 33.3 g/dL (ref 32.0–36.0)
MCV: 94.3 fL (ref 80.0–100.0)
MPV: 11.5 fL (ref 7.5–12.5)
Monocytes Relative: 9.1 %
Neutro Abs: 3574 cells/uL (ref 1500–7800)
Neutrophils Relative %: 62.7 %
Platelets: 152 10*3/uL (ref 140–400)
RBC: 4.87 10*6/uL (ref 3.80–5.10)
RDW: 11.7 % (ref 11.0–15.0)
Total Lymphocyte: 26.9 %
WBC: 5.7 10*3/uL (ref 3.8–10.8)

## 2022-07-09 LAB — LIPID PANEL
Cholesterol: 168 mg/dL (ref <200)
HDL: 47 mg/dL — ABNORMAL LOW (ref >=50)
LDL Cholesterol (Calc): 87 mg/dL (calc)
Non-HDL Cholesterol (Calc): 121 mg/dL (calc) (ref <130)
Total CHOL/HDL Ratio: 3.6 (calc) (ref <5.0)
Triglycerides: 256 mg/dL — ABNORMAL HIGH (ref <150)

## 2022-07-09 LAB — MICROALBUMIN / CREATININE URINE RATIO

## 2022-07-09 LAB — TSH: TSH: 0.47 mIU/L (ref 0.40–4.50)

## 2022-07-09 NOTE — Assessment & Plan Note (Addendum)
Encouraged her to check blood sugar at home and bring the numbers to next office visit. Encouraged her to watch diet and follow regular exercise routine. Continue metformin and glimepiride. Started her on Farxiga sample provided. We will check hemoglobin A1c and urine microalbumin.

## 2022-07-09 NOTE — Progress Notes (Signed)
Please call pt with the abnormal result . She has elevated potassium levels and glucose. Advised her to limit the intake of food containing high potassium example orange juice, banana, spinach, broccoli,  honeydew melon etc. Will repeat labs in 2 weeks. She has  elevated liver enzyme and triglycerides. Make follow up appointment

## 2022-07-09 NOTE — Assessment & Plan Note (Signed)
She had elevated triglyceride levels  on 04/19/2021. Continue statin therapy for cardiovascular risk reduction. We will check the lipids level

## 2022-07-09 NOTE — Assessment & Plan Note (Signed)
Body mass index is 36.56 kg/m. Advised pt to lose weight. Advised patient to avoid trans fat, fatty and fried food. Follow a regular physical activity schedule. Went over the risk of chronic diseases with increased weight.

## 2022-07-11 ENCOUNTER — Other Ambulatory Visit: Payer: Self-pay | Admitting: Internal Medicine

## 2022-07-11 DIAGNOSIS — E782 Mixed hyperlipidemia: Secondary | ICD-10-CM

## 2022-07-17 ENCOUNTER — Other Ambulatory Visit: Payer: Self-pay

## 2022-07-17 MED ORDER — DAPAGLIFLOZIN PROPANEDIOL 10 MG PO TABS: 10.0000 mg | ORAL_TABLET | Freq: Every day | ORAL | 0 refills | Status: DC

## 2022-07-18 ENCOUNTER — Other Ambulatory Visit: Payer: Self-pay

## 2022-07-18 MED ORDER — DAPAGLIFLOZIN PROPANEDIOL 10 MG PO TABS: 10.0000 mg | ORAL_TABLET | Freq: Every day | ORAL | 1 refills | Status: DC

## 2022-07-20 ENCOUNTER — Encounter: Payer: Self-pay | Admitting: Nurse Practitioner

## 2022-07-20 ENCOUNTER — Ambulatory Visit: Payer: BC Managed Care – PPO | Admitting: Nurse Practitioner

## 2022-07-20 VITALS — BP 110/72 | HR 64 | Temp 98.0°F | Resp 15 | Ht 67.0 in | Wt 233.4 lb

## 2022-07-20 DIAGNOSIS — E119 Type 2 diabetes mellitus without complications: Secondary | ICD-10-CM

## 2022-07-20 DIAGNOSIS — E875 Hyperkalemia: Secondary | ICD-10-CM | POA: Diagnosis not present

## 2022-07-20 DIAGNOSIS — R748 Abnormal levels of other serum enzymes: Secondary | ICD-10-CM | POA: Diagnosis not present

## 2022-07-20 DIAGNOSIS — E785 Hyperlipidemia, unspecified: Secondary | ICD-10-CM | POA: Diagnosis not present

## 2022-07-20 MED ORDER — METFORMIN HCL ER 500 MG PO TB24: 500.0000 mg | ORAL_TABLET | Freq: Two times a day (BID) | ORAL | 0 refills | Status: DC

## 2022-07-20 MED ORDER — LISINOPRIL 30 MG PO TABS: 30.0000 mg | ORAL_TABLET | Freq: Every day | ORAL | 1 refills | Status: DC

## 2022-07-20 NOTE — Progress Notes (Signed)
Established Patient Office Visit  Subjective:  Patient ID: Kimberly Rowe, female    DOB: 10-29-1960  Age: 61 y.o. MRN: [id number]  CC:  Chief Complaint  Patient presents with   Follow-up     HPI  Kimberly Rowe presents for labs. She checks her blood pressure at home lies between 100/70 and BS 199. She is taking farxiga and is tolerating well.   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   BREAST BIOPSY Left 07/04/2016   Stereo- path unknown   TONSILLECTOMY      Family History  Problem Relation Age of Onset   Prostate cancer Father    Arthritis Maternal Grandfather    Breast cancer Neg Hx     Social History   Socioeconomic History   Marital status: Married    Spouse name: Not on file   Number of children: Not on file   Years of education: Not on file   Highest education level: Not on file  Occupational History   Not on file  Tobacco Use   Smoking status: Never   Smokeless tobacco: Never  Substance and Sexual Activity   Alcohol use: Yes    Comment: occ   Drug use: No   Sexual activity: Yes    Partners: Male  Other Topics Concern   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    Social Determinants of Health   Financial Resource Strain: Not on file  Food Insecurity: Not on file  Transportation Needs: Not on file  Physical Activity: Not on file   Stress: Not on file  Social Connections: Not on file  Intimate Partner Violence: Not on file     Outpatient Medications Prior to Visit  Medication Sig Dispense Refill   dapagliflozin propanediol (FARXIGA) 10 MG TABS tablet Take 1 tablet (10 mg total) by mouth daily before breakfast. 90 tablet 1   glimepiride (AMARYL) 4 MG tablet TAKE 1 TABLET BY MOUTH ONCE DAILY BEFORE BREAKFAST 30 tablet 0   rosuvastatin (CRESTOR) 20 MG tablet Take 1 tablet by mouth once daily 30 tablet 0   lisinopril (ZESTRIL) 10 MG tablet Take 2 tablets by mouth once daily 60 tablet 0   metFORMIN (GLUCOPHAGE-XR) 500 MG 24 hr tablet Take 1 tablet by mouth twice daily 180 tablet 0   sitaGLIPtin (JANUVIA) 100 MG tablet Take 1 tablet (100 mg total) by mouth daily. 30 tablet 6   No facility-administered medications prior to visit.    No Known Allergies  ROS Review of Systems  Constitutional: Negative.   HENT: Negative.    Eyes: Negative.   Respiratory:  Negative for chest tightness and shortness of breath.   Cardiovascular:  Negative for chest pain and palpitations.  Genitourinary:  Negative.   Neurological: Negative.   Psychiatric/Behavioral:  Negative for agitation, behavioral problems and confusion.       Objective:    Physical Exam Constitutional:      Appearance: Normal appearance. She is obese.  HENT:     Head: Normocephalic.     Right Ear: Tympanic membrane normal.     Left Ear: Tympanic membrane normal.     Nose: Nose normal.     Mouth/Throat:     Mouth: Mucous membranes are moist.     Pharynx: Oropharynx is clear.  Eyes:     Extraocular Movements: Extraocular movements intact.     Conjunctiva/sclera: Conjunctivae normal.     Pupils: Pupils are equal, round, and reactive to light.  Cardiovascular:     Rate and Rhythm: Normal rate and regular rhythm.     Pulses: Normal pulses.     Heart sounds: Murmur heard.  Pulmonary:     Effort: Pulmonary effort is normal. No respiratory distress.      Breath sounds: Normal breath sounds. No rhonchi.  Abdominal:     General: Bowel sounds are normal.     Palpations: Abdomen is soft. There is no mass.     Tenderness: There is no abdominal tenderness.     Hernia: No hernia is present.  Musculoskeletal:        General: Normal range of motion.     Cervical back: Neck supple. No tenderness.  Skin:    General: Skin is warm.     Capillary Refill: Capillary refill takes less than 2 seconds.  Neurological:     General: No focal deficit present.     Mental Status: She is alert and oriented to person, place, and time. Mental status is at baseline.  Psychiatric:        Mood and Affect: Mood normal.        Behavior: Behavior normal.        Thought Content: Thought content normal.        Judgment: Judgment normal.     BP 110/72 (BP Location: Left Arm, Patient Position: Sitting, Cuff Size: Large)   Pulse 64   Temp 98 F (36.7 C) (Temporal)   Resp 15   Ht _0  (1.702 m)   Wt 233 lb 6.4 oz (105.9 kg)   LMP 01/19/2016 (Exact Date)   SpO2 98%   BMI 36.56 kg/m  Wt Readings from Last 3 Encounters:  07/20/22 233 lb 6.4 oz (105.9 kg)  07/06/22 233 lb 6.4 oz (105.9 kg)  06/07/21 239 lb 6.4 oz (108.6 kg)     Health Maintenance  Topic Date Due   FOOT EXAM  05/03/2017   OPHTHALMOLOGY EXAM  05/31/2017   MAMMOGRAM  06/16/2018   COVID-19 Vaccine (3 - 2023-24 season) 08/05/2022 (Originally 04/21/2022)   Zoster Vaccines- Shingrix (1 of 2) 10/19/2022 (Originally 10/10/2012)   COLONOSCOPY (Pts 45-59yr Insurance coverage will need to be confirmed)  07/21/2023 (Originally 10/11/2007)   HIV Screening  07/21/2023 (Originally 10/10/1977)   HEMOGLOBIN A1C  01/04/2023   Diabetic kidney evaluation - eGFR measurement  07/07/2023   Diabetic kidney evaluation - Urine ACR  07/07/2023   Hepatitis C Screening  Completed   HPV VACCINES  Aged Out   DTaP/Tdap/Td  Discontinued   INFLUENZA VACCINE  Discontinued    There are no preventive care reminders to  display for this patient.  Lab Results  Component Value Date   TSH 0.47 07/06/2022   Lab Results  Component Value Date  WBC 5.7 07/06/2022   HGB 15.3 07/06/2022   HCT 45.9 (H) 07/06/2022   MCV 94.3 07/06/2022   PLT 152 07/06/2022   Lab Results  Component Value Date   NA 139 07/06/2022   K 6.0 (H) 07/06/2022   CO2 27 07/06/2022   GLUCOSE 373 (H) 07/06/2022   BUN 21 07/06/2022   CREATININE 0.81 07/06/2022   BILITOT 0.5 07/06/2022   ALKPHOS 90 07/25/2017   AST 33 07/06/2022   ALT 58 (H) 07/06/2022   PROT 7.3 07/06/2022   ALBUMIN 4.0 07/25/2017   CALCIUM 10.3 07/06/2022   ANIONGAP 14 03/02/2016   EGFR 84 07/06/2022   GFR 100.66 07/25/2017   Lab Results  Component Value Date   CHOL 168 07/06/2022   Lab Results  Component Value Date   HDL 47 (L) 07/06/2022   Lab Results  Component Value Date   LDLCALC 87 07/06/2022   Lab Results  Component Value Date   TRIG 256 (H) 07/06/2022   Lab Results  Component Value Date   CHOLHDL 3.6 07/06/2022   Lab Results  Component Value Date   HGBA1C 10.1 (A) 07/06/2022      Assessment & Plan:   Problem List Items Addressed This Visit       Endocrine   Type 2 diabetes mellitus without complication, without long-term current use of insulin ([place]) - Primary    Encourage patient to consume variety of food including fruits, vegetables, whole grains, complex carbohydrates and proteins.  Advised her to check the BS regularly, make a log and bring to next appointment.  Continue metformin, glimepiride and Farxiga.       Relevant Medications   lisinopril (ZESTRIL) 30 MG tablet   metFORMIN (GLUCOPHAGE-XR) 500 MG 24 hr tablet     Other   Hyperlipidemia    She has elevated triglycerides 256 and low HDL 47. Encouraged her to avoid fatty and fried food.       Relevant Medications   lisinopril (ZESTRIL) 30 MG tablet   Hyperkalemia    Advised her to avoid food containing high potassium like potatoes, banana, spinach and  green leafy veggies. Will check potaasium in 2 weeks.      Elevated liver enzymes    She has elevated ALT. At her to follow heart healthy diet and follow regular exercise schedule. Will check labs in 2-week        Meds ordered this encounter  Medications   lisinopril (ZESTRIL) 30 MG tablet    Sig: Take 1 tablet (30 mg total) by mouth daily.    Dispense:  90 tablet    Refill:  1   metFORMIN (GLUCOPHAGE-XR) 500 MG 24 hr tablet    Sig: Take 1 tablet (500 mg total) by mouth 2 (two) times daily.    Dispense:  180 tablet    Refill:  0     Follow-up: No follow-ups on file.    Theresia Lo, NP

## 2022-07-20 NOTE — Assessment & Plan Note (Addendum)
Advised her to avoid food containing high potassium like potatoes, banana, spinach and green leafy veggies. Will check potaasium in 2 weeks.

## 2022-07-21 NOTE — Progress Notes (Signed)
Patient seen in office and got lab results.

## 2022-08-02 ENCOUNTER — Encounter: Payer: Self-pay | Admitting: Nurse Practitioner

## 2022-08-02 DIAGNOSIS — R748 Abnormal levels of other serum enzymes: Secondary | ICD-10-CM | POA: Insufficient documentation

## 2022-08-02 NOTE — Assessment & Plan Note (Signed)
Encourage patient to consume variety of food including fruits, vegetables, whole grains, complex carbohydrates and proteins.  Advised her to check the BS regularly, make a log and bring to next appointment.  Continue metformin, glimepiride and Farxiga.

## 2022-08-02 NOTE — Assessment & Plan Note (Signed)
She has elevated ALT. At her to follow heart healthy diet and follow regular exercise schedule. Will check labs in 2-week

## 2022-08-02 NOTE — Assessment & Plan Note (Signed)
She has elevated triglycerides 256 and low HDL 47. Encouraged her to avoid fatty and fried food.

## 2022-08-03 ENCOUNTER — Encounter: Payer: Self-pay | Admitting: Nurse Practitioner

## 2022-08-03 ENCOUNTER — Ambulatory Visit: Payer: BC Managed Care – PPO | Admitting: Nurse Practitioner

## 2022-08-03 VITALS — BP 118/68 | HR 88 | Ht 67.0 in | Wt 232.8 lb

## 2022-08-03 DIAGNOSIS — R748 Abnormal levels of other serum enzymes: Secondary | ICD-10-CM | POA: Diagnosis not present

## 2022-08-03 DIAGNOSIS — E875 Hyperkalemia: Secondary | ICD-10-CM | POA: Diagnosis not present

## 2022-08-03 NOTE — Progress Notes (Signed)
Established Patient Office Visit  Subjective:  Patient ID: Kimberly Rowe, female    DOB: [birth year]  Age: 61 y.o. MRN: [id number]  CC: No chief complaint on file.    HPI  Kimberly Rowe presents for follow up on hyperkalemia and elevated liver enzymes. She denied palpitations, chest pain, numbness or weakness.  She stated that she is trying to eat more healthy.   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   BREAST BIOPSY Left 07/04/2016   Stereo- path unknown   TONSILLECTOMY      Family History  Problem Relation Age of Onset   Prostate cancer Father    Arthritis Maternal Grandfather    Breast cancer Neg Hx     Social History   Socioeconomic History   Marital status: Married    Spouse name: Not on file   Number of children: Not on file   Years of education: Not on file   Highest education level: Not on file  Occupational History   Not on file  Tobacco Use   Smoking status: Never   Smokeless tobacco: Never  Substance and Sexual Activity   Alcohol use: Yes    Comment: occ   Drug use: No   Sexual activity: Yes    Partners: Male  Other Topics Concern   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    Social Determinants of Health   Financial Resource Strain: Not on file  Food Insecurity: Not on file  Transportation Needs: Not on file  Physical Activity:  Not on file  Stress: Not on file  Social Connections: Not on file  Intimate Partner Violence: Not on file     Outpatient Medications Prior to Visit  Medication Sig Dispense Refill   dapagliflozin propanediol (FARXIGA) 10 MG TABS tablet Take 1 tablet (10 mg total) by mouth daily before breakfast. 90 tablet 1   glimepiride (AMARYL) 4 MG tablet TAKE 1 TABLET BY MOUTH ONCE DAILY BEFORE BREAKFAST 30 tablet 0   lisinopril (ZESTRIL) 30 MG tablet Take 1 tablet (30 mg total) by mouth daily. 90 tablet 1   metFORMIN (GLUCOPHAGE-XR) 500 MG 24 hr tablet Take 1 tablet (500 mg total) by mouth 2 (two) times daily. 180 tablet 0   rosuvastatin (CRESTOR) 20 MG tablet Take 1 tablet by mouth once daily 30 tablet 0   No facility-administered medications prior to visit.    No Known Allergies  ROS Review of Systems  Constitutional: Negative.   HENT: Negative.    Eyes: Negative.   Respiratory:  Negative for shortness of breath.   Cardiovascular:  Negative for chest pain and palpitations.  Gastrointestinal: Negative.   Genitourinary: Negative.   Musculoskeletal: Negative.   Neurological: Negative.   Psychiatric/Behavioral:  Negative.        Objective:    Physical Exam Constitutional:      Appearance: Normal appearance. She is obese.  HENT:     Head: Normocephalic.     Right Ear: Tympanic membrane normal.     Left Ear: Tympanic membrane normal.     Nose: Nose normal.     Mouth/Throat:     Mouth: Mucous membranes are moist.     Pharynx: Oropharynx is clear.  Eyes:     Extraocular Movements: Extraocular movements intact.     Conjunctiva/sclera: Conjunctivae normal.     Pupils: Pupils are equal, round, and reactive to light.  Cardiovascular:     Rate and Rhythm: Normal rate and regular rhythm.     Pulses: Normal pulses.     Heart sounds: Normal heart sounds.  Pulmonary:     Effort: Pulmonary effort is normal. No respiratory distress.     Breath sounds: Normal breath sounds. No rhonchi.   Abdominal:     General: Bowel sounds are normal.     Palpations: Abdomen is soft. There is no mass.     Tenderness: There is no abdominal tenderness.     Hernia: No hernia is present.  Musculoskeletal:        General: Normal range of motion.     Cervical back: Neck supple. No tenderness.  Skin:    General: Skin is warm.     Capillary Refill: Capillary refill takes less than 2 seconds.  Neurological:     General: No focal deficit present.     Mental Status: She is alert and oriented to person, place, and time. Mental status is at baseline.  Psychiatric:        Mood and Affect: Mood normal.        Behavior: Behavior normal.        Thought Content: Thought content normal.        Judgment: Judgment normal.     BP 118/68   Pulse 88   Ht 5' 7" (1.702 m)   Wt 232 lb 12.8 oz (105.6 kg)   LMP 01/19/2016 (Exact Date)   SpO2 95%   BMI 36.46 kg/m  Wt Readings from Last 3 Encounters:  08/03/22 232 lb 12.8 oz (105.6 kg)  07/20/22 233 lb 6.4 oz (105.9 kg)  07/06/22 233 lb 6.4 oz (105.9 kg)     Health Maintenance  Topic Date Due   FOOT EXAM  05/03/2017   OPHTHALMOLOGY EXAM  05/31/2017   MAMMOGRAM  06/16/2018   COVID-19 Vaccine (3 - 2023-24 season) 04/21/2022   Zoster Vaccines- Shingrix (1 of 2) 10/19/2022 (Originally 10/10/2012)   COLONOSCOPY (Pts 45-25yr Insurance coverage will need to be confirmed)  07/21/2023 (Originally 10/11/2007)   HIV Screening  07/21/2023 (Originally 10/10/1977)   HEMOGLOBIN A1C  01/04/2023   Diabetic kidney evaluation - eGFR measurement  07/07/2023   Diabetic kidney evaluation - Urine ACR  07/07/2023   Hepatitis C Screening  Completed   HPV VACCINES  Aged Out   DTaP/Tdap/Td  Discontinued   INFLUENZA VACCINE  Discontinued    There are no preventive care reminders to display for this patient.  Lab Results  Component Value Date   TSH 0.47 07/06/2022   Lab Results  Component Value Date   WBC 5.7 07/06/2022   HGB 15.3 07/06/2022   HCT 45.9 (H)  07/06/2022   MCV 94.3 07/06/2022   PLT 152 07/06/2022   Lab Results  Component Value Date   NA  139 07/06/2022   K 6.0 (H) 07/06/2022   CO2 27 07/06/2022   GLUCOSE 373 (H) 07/06/2022   BUN 21 07/06/2022   CREATININE 0.81 07/06/2022   BILITOT 0.5 07/06/2022   ALKPHOS 90 07/25/2017   AST 33 07/06/2022   ALT 58 (H) 07/06/2022   PROT 7.3 07/06/2022   ALBUMIN 4.0 07/25/2017   CALCIUM 10.3 07/06/2022   ANIONGAP 14 03/02/2016   EGFR 84 07/06/2022   GFR 100.66 07/25/2017   Lab Results  Component Value Date   CHOL 168 07/06/2022   Lab Results  Component Value Date   HDL 47 (L) 07/06/2022   Lab Results  Component Value Date   LDLCALC 87 07/06/2022   Lab Results  Component Value Date   TRIG 256 (H) 07/06/2022   Lab Results  Component Value Date   CHOLHDL 3.6 07/06/2022   Lab Results  Component Value Date   HGBA1C 10.1 (A) 07/06/2022      Assessment & Plan:   Problem List Items Addressed This Visit       Other   Hyperkalemia - Primary    She denies any complaint. Labs ordered.      Relevant Orders   COMPLETE METABOLIC PANEL WITH GFR   Elevated liver enzymes    Patient states she drinks occasionally. Will check liver function.      Relevant Orders   COMPLETE METABOLIC PANEL WITH GFR     No orders of the defined types were placed in this encounter.    Follow-up: No follow-ups on file.    Theresia Lo, NP

## 2022-08-06 NOTE — Assessment & Plan Note (Signed)
She denies any complaint. Labs ordered.

## 2022-08-06 NOTE — Assessment & Plan Note (Signed)
Patient states she drinks occasionally. Will check liver function.

## 2022-08-09 ENCOUNTER — Other Ambulatory Visit: Payer: Self-pay | Admitting: Internal Medicine

## 2022-08-09 DIAGNOSIS — E782 Mixed hyperlipidemia: Secondary | ICD-10-CM

## 2022-08-09 DIAGNOSIS — E119 Type 2 diabetes mellitus without complications: Secondary | ICD-10-CM

## 2022-11-14 ENCOUNTER — Other Ambulatory Visit: Payer: Self-pay | Admitting: Nurse Practitioner

## 2023-02-11 ENCOUNTER — Other Ambulatory Visit: Payer: Self-pay | Admitting: Nurse Practitioner

## 2023-02-13 NOTE — Telephone Encounter (Signed)
Need follow up

## 2023-03-01 NOTE — Telephone Encounter (Signed)
LVM to call back to schedule f/up appt pt has not been in office since 12/23

## 2023-03-05 NOTE — Telephone Encounter (Signed)
Spoke to pt and scheduled in office appt to continue to get refills

## 2023-03-06 NOTE — Telephone Encounter (Signed)
Noted,  Thank you!

## 2023-03-08 ENCOUNTER — Encounter: Payer: Self-pay | Admitting: Nurse Practitioner

## 2023-03-08 ENCOUNTER — Ambulatory Visit: Payer: BC Managed Care – PPO | Admitting: Nurse Practitioner

## 2023-03-08 VITALS — BP 130/78 | HR 78 | Temp 97.5°F | Ht 69.0 in | Wt 223.6 lb

## 2023-03-08 DIAGNOSIS — E785 Hyperlipidemia, unspecified: Secondary | ICD-10-CM

## 2023-03-08 DIAGNOSIS — E119 Type 2 diabetes mellitus without complications: Secondary | ICD-10-CM

## 2023-03-08 DIAGNOSIS — I10 Essential (primary) hypertension: Secondary | ICD-10-CM | POA: Diagnosis not present

## 2023-03-08 DIAGNOSIS — Z7984 Long term (current) use of oral hypoglycemic drugs: Secondary | ICD-10-CM

## 2023-03-08 DIAGNOSIS — Z1321 Encounter for screening for nutritional disorder: Secondary | ICD-10-CM

## 2023-03-08 DIAGNOSIS — Z1231 Encounter for screening mammogram for malignant neoplasm of breast: Secondary | ICD-10-CM

## 2023-03-08 DIAGNOSIS — E782 Mixed hyperlipidemia: Secondary | ICD-10-CM

## 2023-03-08 MED ORDER — LISINOPRIL 30 MG PO TABS: 30.0000 mg | ORAL_TABLET | Freq: Every day | ORAL | 1 refills | Status: DC

## 2023-03-08 MED ORDER — METFORMIN HCL ER 500 MG PO TB24: 500.0000 mg | ORAL_TABLET | Freq: Two times a day (BID) | ORAL | 1 refills | Status: DC

## 2023-03-08 MED ORDER — ROSUVASTATIN CALCIUM 20 MG PO TABS
20.0000 mg | ORAL_TABLET | Freq: Every day | ORAL | 1 refills | Status: DC
Start: 2023-03-08 — End: 2023-09-03

## 2023-03-08 MED ORDER — DAPAGLIFLOZIN PROPANEDIOL 10 MG PO TABS: 10.0000 mg | ORAL_TABLET | Freq: Every day | ORAL | 1 refills | Status: DC

## 2023-03-08 MED ORDER — GLIMEPIRIDE 4 MG PO TABS: 4.0000 mg | ORAL_TABLET | Freq: Every day | ORAL | 1 refills | Status: DC

## 2023-03-08 NOTE — Patient Instructions (Signed)
YOUR MAMMOGRAM IS DUE, PLEASE CALL AND GET THIS SCHEDULED! [facility] - call [phone]  Please schedule fasting labs Rx sent to pharamacy

## 2023-03-08 NOTE — Progress Notes (Signed)
Established Patient Office Visit  Subjective:  Patient ID: Kimberly Rowe, female    DOB: 1961/06/12  Age: 62 y.o. MRN: [id number]  CC:  Chief Complaint  Patient presents with   Medication Refill     HPI  Kimberly Rowe presents for routine follow-up.  She has history of hypertension, hyperlipidemia, diabetes and elevated liver enzymes.   She checks blood sugar at home and the average reading is 160. She is taking farxiga and is tolerating well.   Denies any concerns at present.  She is due for fasting blood work. 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   BREAST BIOPSY Left 07/04/2016   Stereo- path unknown   TONSILLECTOMY      Family History  Problem Relation Age of Onset   Prostate cancer Father    Arthritis Maternal Grandfather    Breast cancer Neg Hx     Social History   Socioeconomic History   Marital status: Married    Spouse name: Not on file   Number of children: Not on file   Years of education: Not on file   Highest education level: Not on file  Occupational History   Not on file  Tobacco Use   Smoking status: Never   Smokeless tobacco: Never  Substance and Sexual Activity   Alcohol use: Yes    Comment: occ   Drug use: No   Sexual activity: Yes    Partners: Male  Other Topics Concern   Not on file  Social History Narrative   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     Social Determinants of Health   Financial Resource Strain: Not on file  Food Insecurity: Not on file  Transportation Needs: Not on file  Physical Activity: Not on file  Stress: Not on file  Social Connections: Not on file  Intimate Partner Violence: Not on file     Outpatient Medications Prior to Visit  Medication Sig Dispense Refill   dapagliflozin propanediol (FARXIGA) 10 MG TABS tablet Take 1 tablet (10 mg total) by mouth daily before breakfast. 90 tablet 1   glimepiride (AMARYL) 4 MG tablet TAKE 1 TABLET BY MOUTH ONCE DAILY BEFORE BREAKFAST 90 tablet 1   lisinopril (ZESTRIL) 30 MG tablet Take 1 tablet by mouth once daily 90 tablet 0   metFORMIN (GLUCOPHAGE-XR) 500 MG 24 hr tablet Take 1 tablet (500 mg total) by mouth 2 (two) times daily. 180 tablet 0   rosuvastatin (CRESTOR) 20 MG tablet Take 1 tablet by mouth once daily 90 tablet 1   No facility-administered medications prior to visit.    No Known Allergies  ROS Review of Systems  Constitutional: Negative.   HENT: Negative.    Eyes: Negative.   Respiratory:  Negative for chest tightness and shortness  of breath.   Cardiovascular:  Negative for palpitations.  Genitourinary: Negative.   Neurological: Negative.   Psychiatric/Behavioral:  Negative for agitation, behavioral problems and confusion.       Objective:    Physical Exam Constitutional:      Appearance: Normal appearance. She is obese.  HENT:     Head: Normocephalic.     Right Ear: Tympanic membrane normal.     Left Ear: Tympanic membrane normal.     Nose: Nose normal.     Mouth/Throat:     Mouth: Mucous membranes are moist.     Pharynx: Oropharynx is clear.  Eyes:     Extraocular Movements: Extraocular movements intact.     Conjunctiva/sclera: Conjunctivae normal.     Pupils: Pupils are equal, round, and reactive to light.  Cardiovascular:     Rate and Rhythm: Normal rate. Rhythm irregular.     Pulses: Normal pulses.     Heart sounds: Murmur  heard.  Pulmonary:     Effort: Pulmonary effort is normal. No respiratory distress.     Breath sounds: Normal breath sounds. No rhonchi.  Abdominal:     General: Bowel sounds are normal.     Palpations: Abdomen is soft. There is no mass.     Tenderness: There is no abdominal tenderness.     Hernia: No hernia is present.  Musculoskeletal:        General: Normal range of motion.     Cervical back: Neck supple. No tenderness.  Skin:    General: Skin is warm.     Capillary Refill: Capillary refill takes less than 2 seconds.  Neurological:     General: No focal deficit present.     Mental Status: She is alert and oriented to person, place, and time. Mental status is at baseline.  Psychiatric:        Mood and Affect: Mood normal.        Behavior: Behavior normal.        Thought Content: Thought content normal.        Judgment: Judgment normal.     BP 130/78   Pulse 78   Temp (!) 97.5 F (36.4 C) (Oral)   Ht 5\' 9"  (1.753 m)   Wt 223 lb 9.6 oz (101.4 kg)   LMP 01/19/2016 (Exact Date)   SpO2 98%   BMI 33.02 kg/m  Wt Readings from Last 3 Encounters:  03/08/23 223 lb 9.6 oz (101.4 kg)  08/03/22 232 lb 12.8 oz (105.6 kg)  07/20/22 233 lb 6.4 oz (105.9 kg)     Health Maintenance  Topic Date Due   Zoster Vaccines- Shingrix (1 of 2) Never done   FOOT EXAM  05/03/2017   OPHTHALMOLOGY EXAM  05/31/2017   MAMMOGRAM  06/16/2018   COVID-19 Vaccine (3 - 2023-24 season) 04/21/2022   HEMOGLOBIN A1C  01/04/2023   Colonoscopy  07/21/2023 (Originally 10/11/2007)   HIV Screening  07/21/2023 (Originally 10[birth year]10/1977)   Diabetic kidney evaluation - eGFR measurement  07/07/2023   Diabetic kidney evaluation - Urine ACR  07/07/2023   Hepatitis C Screening  Completed   HPV VACCINES  Aged Out   DTaP/Tdap/Td  Discontinued   INFLUENZA VACCINE  Discontinued    There are no preventive care reminders to display for this patient.  Lab Results  Component Value Date   TSH 0.47 07/06/2022   Lab  Results  Component Value Date   WBC 5.7 07/06/2022   HGB 15.3 07/06/2022   HCT 45.9 (H)  07/06/2022   MCV 94.3 07/06/2022   PLT 152 07/06/2022   Lab Results  Component Value Date   NA 139 07/06/2022   K 6.0 (H) 07/06/2022   CO2 27 07/06/2022   GLUCOSE 373 (H) 07/06/2022   BUN 21 07/06/2022   CREATININE 0.81 07/06/2022   BILITOT 0.5 07/06/2022   ALKPHOS 90 07/25/2017   AST 33 07/06/2022   ALT 58 (H) 07/06/2022   PROT 7.3 07/06/2022   ALBUMIN 4.0 07/25/2017   CALCIUM 10.3 07/06/2022   ANIONGAP 14 03/02/2016   EGFR 84 07/06/2022   GFR 100.66 07/25/2017   Lab Results  Component Value Date   CHOL 168 07/06/2022   Lab Results  Component Value Date   HDL 47 (L) 07/06/2022   Lab Results  Component Value Date   LDLCALC 87 07/06/2022   Lab Results  Component Value Date   TRIG 256 (H) 07/06/2022   Lab Results  Component Value Date   CHOLHDL 3.6 07/06/2022   Lab Results  Component Value Date   HGBA1C 10.1 (A) 07/06/2022      Assessment & Plan:   Problem List Items Addressed This Visit       Cardiovascular and Mediastinum   Essential hypertension - Primary    Continue lisinopril 30 mg daily Encourage patient to consume low-salt and heart healthy diet. Encouraged her to check blood pressure at home and bring the readings to next appointment. We will continue to monitor. Labs ordered.       Relevant Medications   lisinopril (ZESTRIL) 30 MG tablet   rosuvastatin (CRESTOR) 20 MG tablet   Other Relevant Orders   CBC with Differential/Platelet   Comprehensive metabolic panel   TSH     Endocrine   Type 2 diabetes mellitus without complication, without long-term current use of insulin (HCC)    Lab Results  Component Value Date   HGBA1C 10.1 (A) 07/06/2022  Encourage patient to consume variety of food including fruits, vegetables, whole grains, complex carbohydrates and proteins.  Advised her to check the BS regularly, make a log and bring to next  appointment.  Continue metformin, glimepiride and Farxiga. Will check hemoglobin A1c      Relevant Medications   dapagliflozin propanediol (FARXIGA) 10 MG TABS tablet   glimepiride (AMARYL) 4 MG tablet   lisinopril (ZESTRIL) 30 MG tablet   metFORMIN (GLUCOPHAGE-XR) 500 MG 24 hr tablet   rosuvastatin (CRESTOR) 20 MG tablet   Other Relevant Orders   Hemoglobin A1c     Other   Hyperlipidemia    Lab Results  Component Value Date   CHOL 168 07/06/2022   HDL 47 (L) 07/06/2022   LDLCALC 87 07/06/2022   LDLDIRECT 109.0 05/03/2016   TRIG 256 (H) 07/06/2022   CHOLHDL 3.6 07/06/2022  Continue statin therapy for cardiovascular risk reduction. Encouraged her to avoid fatty and fried food.       Relevant Medications   lisinopril (ZESTRIL) 30 MG tablet   rosuvastatin (CRESTOR) 20 MG tablet   Other Relevant Orders   Lipid panel   Other Visit Diagnoses     Screening mammogram, encounter for       Relevant Orders   MM 3D SCREENING MAMMOGRAM BILATERAL BREAST   Encounter for vitamin deficiency screening       Relevant Orders   VITAMIN D 25 Hydroxy (Vit-D Deficiency, Fractures)        Meds ordered this encounter  Medications   dapagliflozin propanediol (FARXIGA) 10 MG TABS tablet  Sig: Take 1 tablet (10 mg total) by mouth daily before breakfast.    Dispense:  90 tablet    Refill:  1    Order Specific Question:   Lot Number?    Answer:   [id number]    Order Specific Question:   Expiration Date?    Answer:   01/18/2025    Order Specific Question:   Quantity    Answer:   7   glimepiride (AMARYL) 4 MG tablet    Sig: Take 1 tablet (4 mg total) by mouth daily before breakfast.    Dispense:  90 tablet    Refill:  1   lisinopril (ZESTRIL) 30 MG tablet    Sig: Take 1 tablet (30 mg total) by mouth daily.    Dispense:  90 tablet    Refill:  1   metFORMIN (GLUCOPHAGE-XR) 500 MG 24 hr tablet    Sig: Take 1 tablet (500 mg total) by mouth 2 (two) times daily.    Dispense:  180 tablet     Refill:  1   rosuvastatin (CRESTOR) 20 MG tablet    Sig: Take 1 tablet (20 mg total) by mouth daily.    Dispense:  90 tablet    Refill:  1     Follow-up: Return in about 6 months (around 09/08/2023).    Kara Dies, NP

## 2023-03-19 NOTE — Assessment & Plan Note (Signed)
Lab Results  Component Value Date   CHOL 168 07/06/2022   HDL 47 (L) 07/06/2022   LDLCALC 87 07/06/2022   LDLDIRECT 109.0 05/03/2016   TRIG 256 (H) 07/06/2022   CHOLHDL 3.6 07/06/2022  Continue statin therapy for cardiovascular risk reduction. Encouraged her to avoid fatty and fried food.

## 2023-03-19 NOTE — Assessment & Plan Note (Signed)
Lab Results  Component Value Date   HGBA1C 10.1 (A) 07/06/2022  Encourage patient to consume variety of food including fruits, vegetables, whole grains, complex carbohydrates and proteins.  Advised her to check the BS regularly, make a log and bring to next appointment.  Continue metformin, glimepiride and Farxiga. Will check hemoglobin A1c

## 2023-03-19 NOTE — Assessment & Plan Note (Signed)
Continue lisinopril 30 mg daily Encourage patient to consume low-salt and heart healthy diet. Encouraged her to check blood pressure at home and bring the readings to next appointment. We will continue to monitor. Labs ordered.

## 2023-03-22 ENCOUNTER — Other Ambulatory Visit (INDEPENDENT_AMBULATORY_CARE_PROVIDER_SITE_OTHER): Payer: BC Managed Care – PPO

## 2023-03-22 DIAGNOSIS — Z1321 Encounter for screening for nutritional disorder: Secondary | ICD-10-CM

## 2023-03-22 DIAGNOSIS — E119 Type 2 diabetes mellitus without complications: Secondary | ICD-10-CM | POA: Diagnosis not present

## 2023-03-22 DIAGNOSIS — I10 Essential (primary) hypertension: Secondary | ICD-10-CM

## 2023-03-22 DIAGNOSIS — E785 Hyperlipidemia, unspecified: Secondary | ICD-10-CM

## 2023-03-22 LAB — CBC WITH DIFFERENTIAL/PLATELET
Basophils Absolute: 0 10*3/uL (ref 0.0–0.1)
Basophils Relative: 0.3 % (ref 0.0–3.0)
Eosinophils Absolute: 0.1 10*3/uL (ref 0.0–0.7)
Eosinophils Relative: 1.6 % (ref 0.0–5.0)
HCT: 45.4 % (ref 36.0–46.0)
Hemoglobin: 14.8 g/dL (ref 12.0–15.0)
Lymphocytes Relative: 35.6 % (ref 12.0–46.0)
Lymphs Abs: 1.8 10*3/uL (ref 0.7–4.0)
MCHC: 32.7 g/dL (ref 30.0–36.0)
MCV: 96.5 fl (ref 78.0–100.0)
Monocytes Absolute: 0.5 10*3/uL (ref 0.1–1.0)
Monocytes Relative: 9.6 % (ref 3.0–12.0)
Neutro Abs: 2.7 10*3/uL (ref 1.4–7.7)
Neutrophils Relative %: 52.9 % (ref 43.0–77.0)
Platelets: 159 10*3/uL (ref 150.0–400.0)
RBC: 4.7 Mil/uL (ref 3.87–5.11)
RDW: 13.6 % (ref 11.5–15.5)
WBC: 5.1 10*3/uL (ref 4.0–10.5)

## 2023-03-22 LAB — LIPID PANEL
Cholesterol: 151 mg/dL (ref 0–200)
HDL: 53.1 mg/dL (ref >39.00)
LDL Cholesterol: 67 mg/dL (ref 0–99)
NonHDL: 97.4
Total CHOL/HDL Ratio: 3
Triglycerides: 152 mg/dL — ABNORMAL HIGH (ref 0.0–149.0)
VLDL: 30.4 mg/dL (ref 0.0–40.0)

## 2023-03-22 LAB — COMPREHENSIVE METABOLIC PANEL
ALT: 23 U/L (ref 0–35)
AST: 19 U/L (ref 0–37)
Albumin: 4.2 g/dL (ref 3.5–5.2)
Alkaline Phosphatase: 73 U/L (ref 39–117)
BUN: 21 mg/dL (ref 6–23)
CO2: 29 mEq/L (ref 19–32)
Calcium: 10.4 mg/dL (ref 8.4–10.5)
Chloride: 103 mEq/L (ref 96–112)
Creatinine, Ser: 0.84 mg/dL (ref 0.40–1.20)
GFR: 75.52 mL/min (ref >60.00)
Glucose, Bld: 160 mg/dL — ABNORMAL HIGH (ref 70–99)
Potassium: 5.4 mEq/L — ABNORMAL HIGH (ref 3.5–5.1)
Sodium: 140 mEq/L (ref 135–145)
Total Bilirubin: 0.5 mg/dL (ref 0.2–1.2)
Total Protein: 7.3 g/dL (ref 6.0–8.3)

## 2023-03-22 LAB — VITAMIN D 25 HYDROXY (VIT D DEFICIENCY, FRACTURES): VITD: 25.38 ng/mL — ABNORMAL LOW (ref 30.00–100.00)

## 2023-03-22 LAB — TSH: TSH: 0.43 u[IU]/mL (ref 0.35–5.50)

## 2023-03-22 LAB — HEMOGLOBIN A1C: Hgb A1c MFr Bld: 10.5 % — ABNORMAL HIGH (ref 4.6–6.5)

## 2023-04-05 ENCOUNTER — Ambulatory Visit
Admission: RE | Admit: 2023-04-05 | Discharge: 2023-04-05 | Disposition: A | Discharge status: Home / Self Care | Payer: BC Managed Care – PPO | Source: Ambulatory Visit | Attending: Nurse Practitioner | Admitting: Nurse Practitioner

## 2023-04-05 DIAGNOSIS — Z1231 Encounter for screening mammogram for malignant neoplasm of breast: Secondary | ICD-10-CM | POA: Diagnosis present

## 2023-05-31 ENCOUNTER — Telehealth: Payer: Self-pay | Admitting: Nurse Practitioner

## 2023-05-31 ENCOUNTER — Other Ambulatory Visit: Payer: Self-pay | Admitting: Nurse Practitioner

## 2023-05-31 DIAGNOSIS — E119 Type 2 diabetes mellitus without complications: Secondary | ICD-10-CM

## 2023-05-31 NOTE — Telephone Encounter (Signed)
Left message to call the office back to schedule a A1c recheck after 06/22/23.

## 2023-06-01 NOTE — Telephone Encounter (Signed)
LVM for patient to call back to schedule lab appointment fo A1C recheck

## 2023-08-28 ENCOUNTER — Other Ambulatory Visit: Payer: Self-pay | Admitting: Nurse Practitioner

## 2023-08-28 DIAGNOSIS — E119 Type 2 diabetes mellitus without complications: Secondary | ICD-10-CM

## 2023-08-29 ENCOUNTER — Other Ambulatory Visit: Payer: Self-pay | Admitting: Nurse Practitioner

## 2023-09-02 ENCOUNTER — Other Ambulatory Visit: Payer: Self-pay | Admitting: Nurse Practitioner

## 2023-09-02 DIAGNOSIS — E782 Mixed hyperlipidemia: Secondary | ICD-10-CM

## 2023-09-06 ENCOUNTER — Ambulatory Visit: Payer: BC Managed Care – PPO | Admitting: Nurse Practitioner

## 2023-09-13 ENCOUNTER — Encounter: Payer: Self-pay | Admitting: Nurse Practitioner

## 2023-09-13 ENCOUNTER — Ambulatory Visit: Payer: BC Managed Care – PPO | Admitting: Nurse Practitioner

## 2023-09-13 VITALS — BP 118/78 | HR 82 | Temp 98.2°F | Ht 69.0 in | Wt 231.6 lb

## 2023-09-13 DIAGNOSIS — E785 Hyperlipidemia, unspecified: Secondary | ICD-10-CM

## 2023-09-13 DIAGNOSIS — E119 Type 2 diabetes mellitus without complications: Secondary | ICD-10-CM | POA: Diagnosis not present

## 2023-09-13 DIAGNOSIS — E559 Vitamin D deficiency, unspecified: Secondary | ICD-10-CM

## 2023-09-13 DIAGNOSIS — Z7984 Long term (current) use of oral hypoglycemic drugs: Secondary | ICD-10-CM | POA: Diagnosis not present

## 2023-09-13 DIAGNOSIS — E875 Hyperkalemia: Secondary | ICD-10-CM

## 2023-09-13 DIAGNOSIS — I10 Essential (primary) hypertension: Secondary | ICD-10-CM

## 2023-09-13 LAB — COMPREHENSIVE METABOLIC PANEL
ALT: 27 U/L (ref 0–35)
AST: 20 U/L (ref 0–37)
Albumin: 4.1 g/dL (ref 3.5–5.2)
Alkaline Phosphatase: 86 U/L (ref 39–117)
BUN: 28 mg/dL — ABNORMAL HIGH (ref 6–23)
CO2: 26 meq/L (ref 19–32)
Calcium: 9.5 mg/dL (ref 8.4–10.5)
Chloride: 104 meq/L (ref 96–112)
Creatinine, Ser: 0.91 mg/dL (ref 0.40–1.20)
GFR: 68.37 mL/min (ref >60.00)
Glucose, Bld: 168 mg/dL — ABNORMAL HIGH (ref 70–99)
Potassium: 4.8 meq/L (ref 3.5–5.1)
Sodium: 137 meq/L (ref 135–145)
Total Bilirubin: 0.3 mg/dL (ref 0.2–1.2)
Total Protein: 7.1 g/dL (ref 6.0–8.3)

## 2023-09-13 LAB — CBC WITH DIFFERENTIAL/PLATELET
Basophils Absolute: 0 10*3/uL (ref 0.0–0.1)
Basophils Relative: 0.4 % (ref 0.0–3.0)
Eosinophils Absolute: 0.1 10*3/uL (ref 0.0–0.7)
Eosinophils Relative: 1 % (ref 0.0–5.0)
HCT: 43.7 % (ref 36.0–46.0)
Hemoglobin: 14.6 g/dL (ref 12.0–15.0)
Lymphocytes Relative: 27.8 % (ref 12.0–46.0)
Lymphs Abs: 1.7 10*3/uL (ref 0.7–4.0)
MCHC: 33.5 g/dL (ref 30.0–36.0)
MCV: 96.4 fL (ref 78.0–100.0)
Monocytes Absolute: 0.5 10*3/uL (ref 0.1–1.0)
Monocytes Relative: 7.8 % (ref 3.0–12.0)
Neutro Abs: 3.8 10*3/uL (ref 1.4–7.7)
Neutrophils Relative %: 63 % (ref 43.0–77.0)
Platelets: 171 10*3/uL (ref 150.0–400.0)
RBC: 4.53 Mil/uL (ref 3.87–5.11)
RDW: 13.4 % (ref 11.5–15.5)
WBC: 6 10*3/uL (ref 4.0–10.5)

## 2023-09-13 LAB — LIPID PANEL
Cholesterol: 139 mg/dL (ref 0–200)
HDL: 46.7 mg/dL (ref >39.00)
LDL Cholesterol: 36 mg/dL (ref 0–99)
NonHDL: 92.5
Total CHOL/HDL Ratio: 3
Triglycerides: 285 mg/dL — ABNORMAL HIGH (ref 0.0–149.0)
VLDL: 57 mg/dL — ABNORMAL HIGH (ref 0.0–40.0)

## 2023-09-13 LAB — HEMOGLOBIN A1C: Hgb A1c MFr Bld: 11.1 % — ABNORMAL HIGH (ref 4.6–6.5)

## 2023-09-13 LAB — MICROALBUMIN / CREATININE URINE RATIO
Creatinine,U: 45.7 mg/dL
Microalb Creat Ratio: 1.5 mg/g (ref 0.0–30.0)
Microalb, Ur: <0.7 mg/dL (ref 0.0–1.9)

## 2023-09-13 LAB — TSH: TSH: 0.54 u[IU]/mL (ref 0.35–5.50)

## 2023-09-13 MED ORDER — LISINOPRIL 30 MG PO TABS: 30.0000 mg | ORAL_TABLET | Freq: Every day | ORAL | 1 refills | Status: DC

## 2023-09-13 MED ORDER — DAPAGLIFLOZIN PROPANEDIOL 10 MG PO TABS: 10.0000 mg | ORAL_TABLET | Freq: Every day | ORAL | 1 refills | Status: DC

## 2023-09-13 NOTE — Assessment & Plan Note (Addendum)
Blood glucose levels ranging from 180-200 postprandial. Last A1C was above 10. Currently on Metformin, Farxiga, and Glimepiride. Patient is attempting to manage diet. -Microfilament foot exam normal. -Check A1C today. -Advise patient to check fasting blood glucose levels. -Continue Metformin, Farxiga, and Glimepiride.

## 2023-09-13 NOTE — Assessment & Plan Note (Signed)
Blood pressure appears to be controlled at home with readings between 100-120. Currently on Lisinopril. -Continue Lisinopril.

## 2023-09-13 NOTE — Assessment & Plan Note (Signed)
Currently on Rosuvastatin. -Continue Rosuvastatin for cardiovascular risk reduction.

## 2023-09-13 NOTE — Assessment & Plan Note (Signed)
Previous labs showed elevated potassium levels. She denise using salt substitute. -Check potassium levels today

## 2023-09-13 NOTE — Patient Instructions (Addendum)
Please go to the lab for blood work. You are due for colon screening (colonoscopy/Cologuard) , pneumonia, and shingles vaccines you declined at present.

## 2023-09-13 NOTE — Assessment & Plan Note (Signed)
Previous labs showed low Vitamin D levels. -Start over the counter Vitamin D supplements 1000-2000 units daily.

## 2023-09-13 NOTE — Progress Notes (Signed)
Established Patient Office Visit  Subjective:  Patient ID: Kimberly Rowe, female    DOB: [birth year]  Age: 63 y.o. MRN: [id number]  CC:  Chief Complaint  Patient presents with   Medical Management of Chronic Issues   Discussed the use of a AI scribe software for clinical note transcription with the patient, who gave verbal consent to proceed.  HPI  Kimberly Rowe  present for routine follow up. She has history of diabetes, hypertension, and hyperlipidemia.  DM: Has been monitoring her blood sugar levels daily, reporting values ranging from 180 to 200. These readings are typically taken post-prandially, around 1:30 PM, after a 10:30 AM meal. The patient works early morning shifts, starting at 3 AM, and her eating schedule varies based on hunger levels upon waking. She is currently on a regimen of metformin, Farxiga and glimepiride.  Hyperlipidemia: She has been making efforts to manage her diet, focusing on reducing carbohydrate and sugar intake, and increasing consumption of fruits and vegetables. She denies frequent consumption of fast food, with occasional intake of [employer].  Hypertension: She checks her blood pressure at home, reporting readings between 100 and 120.  Complaint with lisinopril.   The patient's vitamin D levels were previously noted to be low, but she is not currently taking any supplements. She was advised to start over-the-counter vitamin D supplements. Her potassium levels were also noted to be elevated. Will recheck.  She denies any chest pain, shortness of breath, stomach issues, constipation, diarrhea, numbness, tingling sensation, or neuropathy.  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   BREAST BIOPSY Left 07/04/2016   Stereo- path unknown   TONSILLECTOMY      Family History  Problem Relation Age of Onset   Prostate cancer Father    Arthritis Maternal Grandfather    Breast cancer Neg Hx     Social History   Socioeconomic History   Marital status: Married    Spouse name: Not on file   Number of children: Not on file   Years of education: Not on file   Highest education level: Not on file  Occupational History   Not on file  Tobacco Use   Smoking status: Never   Smokeless tobacco: Never  Substance and Sexual Activity   Alcohol use: Yes    Comment: occ   Drug use: No   Sexual activity: Yes    Partners: Male  Other Topics Concern   Not on file  Social History Narrative   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    Social Drivers of Corporate investment banker Strain: Not on file  Food Insecurity: Not on file  Transportation Needs: Not on file  Physical Activity: Not on file  Stress: Not on file  Social Connections: Not  on file  Intimate Partner Violence: Not on file     Outpatient Medications Prior to Visit  Medication Sig Dispense Refill   glimepiride (AMARYL) 4 MG tablet TAKE 1 TABLET BY MOUTH ONCE DAILY BEFORE BREAKFAST 90 tablet 0   metFORMIN (GLUCOPHAGE-XR) 500 MG 24 hr tablet Take 1 tablet by mouth twice daily 180 tablet 0   rosuvastatin (CRESTOR) 20 MG tablet Take 1 tablet by mouth once daily 90 tablet 0   dapagliflozin propanediol (FARXIGA) 10 MG TABS tablet Take 1 tablet (10 mg total) by mouth daily before breakfast. 90 tablet 1   lisinopril (ZESTRIL) 30 MG tablet Take 1 tablet (30 mg total) by mouth daily. 90 tablet 1   No  facility-administered medications prior to visit.    No Known Allergies  ROS Review of Systems Negative unless indicated in HPI.    Objective:    Physical Exam Constitutional:      Appearance: Normal appearance.  HENT:     Right Ear: Tympanic membrane normal.     Left Ear: Tympanic membrane normal.     Mouth/Throat:     Mouth: Mucous membranes are moist.  Eyes:     Conjunctiva/sclera: Conjunctivae normal.     Pupils: Pupils are equal, round, and reactive to light.  Cardiovascular:     Rate and Rhythm: Normal rate and regular rhythm.     Pulses: Normal pulses.     Heart sounds: Normal heart sounds.  Pulmonary:     Effort: Pulmonary effort is normal.     Breath sounds: Normal breath sounds.  Abdominal:     General: Bowel sounds are normal.     Palpations: Abdomen is soft.  Musculoskeletal:     Cervical back: Normal range of motion. No tenderness.  Skin:    General: Skin is warm.     Findings: No bruising.  Neurological:     General: No focal deficit present.     Mental Status: She is alert and oriented to person, place, and time. Mental status is at baseline.  Psychiatric:        Mood and Affect: Mood normal.        Behavior: Behavior normal.        Thought Content: Thought content normal.        Judgment: Judgment normal.     BP 118/78   Pulse 82   Temp 98.2 F (36.8 C)   Ht 5\' 9"  (1.753 m)   Wt 231 lb 9.6 oz (105.1 kg)   LMP 01/19/2016 (Exact Date)   SpO2 97%   BMI 34.20 kg/m  Wt Readings from Last 3 Encounters:  09/13/23 231 lb 9.6 oz (105.1 kg)  03/08/23 223 lb 9.6 oz (101.4 kg)  08/03/22 232 lb 12.8 oz (105.6 kg)     Health Maintenance  Topic Date Due   Pneumococcal Vaccine 72-68 Years old (1 of 2 - PCV) Never done   HIV Screening  Never done   Colonoscopy  Never done   Zoster Vaccines- Shingrix (1 of 2) Never done   OPHTHALMOLOGY EXAM  05/31/2017   COVID-19 Vaccine (3 - 2024-25 season) 09/21/2023 (Originally 04/22/2023)   HEMOGLOBIN A1C   03/12/2024   Diabetic kidney evaluation - eGFR measurement  09/12/2024   Diabetic kidney evaluation - Urine ACR  09/12/2024   FOOT EXAM  09/12/2024   MAMMOGRAM  04/04/2025   Hepatitis C Screening  Completed   HPV VACCINES  Aged Out   DTaP/Tdap/Td  Discontinued   INFLUENZA VACCINE  Discontinued    There are no preventive care reminders to display for this patient.  Lab Results  Component Value Date   TSH 0.54 09/13/2023   Lab Results  Component Value Date   WBC 6.0 09/13/2023   HGB 14.6 09/13/2023   HCT 43.7 09/13/2023   MCV 96.4 09/13/2023   PLT 171.0 09/13/2023   Lab Results  Component Value Date   NA 137 09/13/2023   K 4.8 09/13/2023   CO2 26 09/13/2023   GLUCOSE 168 (H) 09/13/2023   BUN 28 (H) 09/13/2023   CREATININE 0.91 09/13/2023   BILITOT 0.3 09/13/2023   ALKPHOS 86 09/13/2023   AST 20 09/13/2023   ALT 27 09/13/2023   PROT 7.1 09/13/2023   ALBUMIN 4.1 09/13/2023   CALCIUM 9.5 09/13/2023   ANIONGAP 14 03/02/2016   EGFR 84 07/06/2022   GFR 68.37 09/13/2023   Lab Results  Component Value Date   CHOL 139 09/13/2023   Lab Results  Component Value Date   HDL 46.70 09/13/2023   Lab Results  Component Value Date   LDLCALC 36 09/13/2023   Lab Results  Component Value Date   TRIG 285.0 (H) 09/13/2023   Lab Results  Component Value Date   CHOLHDL 3 09/13/2023   Lab Results  Component Value Date   HGBA1C 11.1 (H) 09/13/2023      Assessment & Plan:  Type 2 diabetes mellitus without complication, without long-term current use of insulin (HCC) Assessment & Plan: Blood glucose levels ranging from 180-200 postprandial. Last A1C was above 10. Currently on Metformin, Farxiga, and Glimepiride. Patient is attempting to manage diet. -Microfilament foot exam normal. -Check A1C today. -Advise patient to check fasting blood glucose levels. -Continue Metformin, Farxiga, and Glimepiride.  Orders: -     Hemoglobin A1c -     Microalbumin / creatinine urine  ratio  Essential hypertension Assessment & Plan: Blood pressure appears to be controlled at home with readings between 100-120. Currently on Lisinopril. -Continue Lisinopril.  Orders: -     CBC with Differential/Platelet -     Comprehensive metabolic panel -     TSH  Hyperlipidemia, unspecified hyperlipidemia type Assessment & Plan: Currently on Rosuvastatin. -Continue Rosuvastatin for cardiovascular risk reduction.   Orders: -     Lipid panel  Hyperkalemia Assessment & Plan: Previous labs showed elevated potassium levels. She denise using salt substitute. -Check potassium levels today   Vitamin D deficiency Assessment & Plan: Previous labs showed low Vitamin D levels. -Start over the counter Vitamin D supplements 1000-2000 units daily.   Other orders -     Dapagliflozin Propanediol; Take 1 tablet (10 mg total) by mouth daily before breakfast.  Dispense: 90 tablet; Refill: 1 -     Lisinopril; Take 1 tablet (30 mg total) by mouth daily.  Dispense: 90 tablet; Refill: 1    Follow-up: Return in about 15 weeks (around 12/27/2023), or if symptoms worsen or fail to improve, for physical.   Kara Dies, NP

## 2023-11-24 ENCOUNTER — Other Ambulatory Visit: Payer: Self-pay | Admitting: Nurse Practitioner

## 2023-11-24 DIAGNOSIS — E119 Type 2 diabetes mellitus without complications: Secondary | ICD-10-CM

## 2023-11-29 ENCOUNTER — Other Ambulatory Visit: Payer: Self-pay | Admitting: Nurse Practitioner

## 2023-11-29 DIAGNOSIS — E782 Mixed hyperlipidemia: Secondary | ICD-10-CM

## 2023-12-13 ENCOUNTER — Encounter: Payer: Self-pay | Admitting: Nurse Practitioner

## 2023-12-13 ENCOUNTER — Ambulatory Visit: Payer: BC Managed Care – PPO | Admitting: Nurse Practitioner

## 2023-12-13 VITALS — BP 126/78 | HR 80 | Temp 97.9°F | Ht 69.0 in | Wt 226.4 lb

## 2023-12-13 DIAGNOSIS — Z7984 Long term (current) use of oral hypoglycemic drugs: Secondary | ICD-10-CM | POA: Diagnosis not present

## 2023-12-13 DIAGNOSIS — E1165 Type 2 diabetes mellitus with hyperglycemia: Secondary | ICD-10-CM | POA: Diagnosis not present

## 2023-12-13 DIAGNOSIS — E785 Hyperlipidemia, unspecified: Secondary | ICD-10-CM | POA: Diagnosis not present

## 2023-12-13 LAB — LIPID PANEL
Cholesterol: 147 mg/dL (ref 0–200)
HDL: 55.6 mg/dL (ref >39.00)
LDL Cholesterol: 66 mg/dL (ref 0–99)
NonHDL: 91.09
Total CHOL/HDL Ratio: 3
Triglycerides: 126 mg/dL (ref 0.0–149.0)
VLDL: 25.2 mg/dL (ref 0.0–40.0)

## 2023-12-13 LAB — HEMOGLOBIN A1C: Hgb A1c MFr Bld: 10.7 % — ABNORMAL HIGH (ref 4.6–6.5)

## 2023-12-13 NOTE — Patient Instructions (Addendum)
 Take farxiga , glimepiride  and lisinopril  in the morning Metformin  and Rosuvastatin  in th evening.  Diabetes Mellitus and Nutrition, Adult When you have diabetes, or diabetes mellitus, it is very important to have healthy eating habits because your blood sugar (glucose) levels are greatly affected by what you eat and drink. Eating healthy foods in the right amounts, at about the same times every day, can help you: Manage your blood glucose. Lower your risk of heart disease. Improve your blood pressure. Reach or maintain a healthy weight. What can affect my meal plan? Every person with diabetes is different, and each person has different needs for a meal plan. Your health care provider may recommend that you work with a dietitian to make a meal plan that is best for you. Your meal plan may vary depending on factors such as: The calories you need. The medicines you take. Your weight. Your blood glucose, blood pressure, and cholesterol levels. Your activity level. Other health conditions you have, such as heart or kidney disease. How do carbohydrates affect me? Carbohydrates, also called carbs, affect your blood glucose level more than any other type of food. Eating carbs raises the amount of glucose in your blood. It is important to know how many carbs you can safely have in each meal. This is different for every person. Your dietitian can help you calculate how many carbs you should have at each meal and for each snack. How does alcohol affect me? Alcohol can cause a decrease in blood glucose (hypoglycemia), especially if you use insulin or take certain diabetes medicines by mouth. Hypoglycemia can be a life-threatening condition. Symptoms of hypoglycemia, such as sleepiness, dizziness, and confusion, are similar to symptoms of having too much alcohol. Do not drink alcohol if: Your health care provider tells you not to drink. You are pregnant, may be pregnant, or are planning to become  pregnant. If you drink alcohol: Limit how much you have to: 0-1 drink a day for women. 0-2 drinks a day for men. Know how much alcohol is in your drink. In the [place] one drink equals one 12 oz bottle of beer (355 mL), one 5 oz glass of wine (148 mL), or one 1 oz glass of hard liquor (44 mL). Keep yourself hydrated with water, diet soda, or unsweetened iced tea. Keep in mind that regular soda, juice, and other mixers may contain a lot of sugar and must be counted as carbs. What are tips for following this plan?  Reading food labels Start by checking the serving size on the Nutrition Facts label of packaged foods and drinks. The number of calories and the amount of carbs, fats, and other nutrients listed on the label are based on one serving of the item. Many items contain more than one serving per package. Check the total grams (g) of carbs in one serving. Check the number of grams of saturated fats and trans fats in one serving. Choose foods that have a low amount or none of these fats. Check the number of milligrams (mg) of salt (sodium) in one serving. Most people should limit total sodium intake to less than 2,300 mg per day. Always check the nutrition information of foods labeled as "low-fat" or "nonfat." These foods may be higher in added sugar or refined carbs and should be avoided. Talk to your dietitian to identify your daily goals for nutrients listed on the label. Shopping Avoid buying canned, pre-made, or processed foods. These foods tend to be high in fat, sodium, and  added sugar. Shop around the outside edge of the grocery store. This is where you will most often find fresh fruits and vegetables, bulk grains, fresh meats, and fresh dairy products. Cooking Use low-heat cooking methods, such as baking, instead of high-heat cooking methods, such as deep frying. Cook using healthy oils, such as olive, canola, or sunflower oil. Avoid cooking with butter, cream, or high-fat meats. Meal  planning Eat meals and snacks regularly, preferably at the same times every day. Avoid going long periods of time without eating. Eat foods that are high in fiber, such as fresh fruits, vegetables, beans, and whole grains. Eat 4-6 oz (112-168 g) of lean protein each day, such as lean meat, chicken, fish, eggs, or tofu. One ounce (oz) (28 g) of lean protein is equal to: 1 oz (28 g) of meat, chicken, or fish. 1 egg.  cup (62 g) of tofu. Eat some foods each day that contain healthy fats, such as avocado, nuts, seeds, and fish. What foods should I eat? Fruits Berries. Apples. Oranges. Peaches. Apricots. Plums. Grapes. Mangoes. Papayas. Pomegranates. Kiwi. Cherries. Vegetables Leafy greens, including lettuce, spinach, kale, chard, collard greens, mustard greens, and cabbage. Beets. Cauliflower. Broccoli. Carrots. Green beans. Tomatoes. Peppers. Onions. Cucumbers. Brussels sprouts. Grains Whole grains, such as whole-wheat or whole-grain bread, crackers, tortillas, cereal, and pasta. Unsweetened oatmeal. Quinoa. Brown or wild rice. Meats and other proteins Seafood. Poultry without skin. Lean cuts of poultry and beef. Tofu. Nuts. Seeds. Dairy Low-fat or fat-free dairy products such as milk, yogurt, and cheese. The items listed above may not be a complete list of foods and beverages you can eat and drink. Contact a dietitian for more information. What foods should I avoid? Fruits Fruits canned with syrup. Vegetables Canned vegetables. Frozen vegetables with butter or cream sauce. Grains Refined white flour and flour products such as bread, pasta, snack foods, and cereals. Avoid all processed foods. Meats and other proteins Fatty cuts of meat. Poultry with skin. Breaded or fried meats. Processed meat. Avoid saturated fats. Dairy Full-fat yogurt, cheese, or milk. Beverages Sweetened drinks, such as soda or iced tea. The items listed above may not be a complete list of foods and beverages you  should avoid. Contact a dietitian for more information. Questions to ask a health care provider Do I need to meet with a certified diabetes care and education specialist? Do I need to meet with a dietitian? What number can I call if I have questions? When are the best times to check my blood glucose? Where to find more information: American Diabetes Association: diabetes.org Academy of Nutrition and Dietetics: eatright.[employer] of Diabetes and Digestive and Kidney Diseases: StageSync.si Association of Diabetes Care & Education Specialists: diabeteseducator.org Summary It is important to have healthy eating habits because your blood sugar (glucose) levels are greatly affected by what you eat and drink. It is important to use alcohol carefully. A healthy meal plan will help you manage your blood glucose and lower your risk of heart disease. Your health care provider may recommend that you work with a dietitian to make a meal plan that is best for you. This information is not intended to replace advice given to you by your health care provider. Make sure you discuss any questions you have with your health care provider. Document Revised: 03/09/2020 Document Reviewed: 03/10/2020 Elsevier Patient Education  2024 [employer].

## 2023-12-13 NOTE — Progress Notes (Signed)
 Established Patient Office Visit  Subjective:  Patient ID: Kimberly Rowe, female    DOB: 1961-07-22  Age: 63 y.o. MRN: [id number]  CC:  Chief Complaint  Patient presents with   Medical Management of Chronic Issues    HPI  [name] presents for follow up on diabetes.  Her last Hg A1c was 11.1 on 09/13/23.   Blood Sugar ranges: fasting BS 180-220  Polyuria/phagia/dipsia: No    Optho- Due  Medications Compliance- Forgets to take Farxiga  2 times a week, take metformin  and glypirimde regularly.   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   BREAST BIOPSY Left 07/04/2016   Stereo- path unknown   TONSILLECTOMY      Family History  Problem Relation Age of Onset   Prostate cancer Father    Arthritis Maternal Grandfather    Breast cancer Neg Hx     Social History   Socioeconomic History   Marital status: Married    Spouse name: Not on file   Number of children: Not on file   Years of education: Not on file   Highest education level: Not on file  Occupational History   Not on file  Tobacco Use   Smoking status: Never   Smokeless tobacco: Never  Substance and Sexual Activity   Alcohol use: Yes    Comment: occ   Drug use: No   Sexual activity: Yes    Partners: Male  Other Topics Concern   Not on file  Social History Narrative   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    1 dog    Partner of 31  years.    Social Drivers of Corporate investment banker Strain: Not on file  Food Insecurity: Not on file  Transportation Needs: Not on file  Physical Activity: Not on file  Stress: Not on file  Social Connections: Not on file  Intimate Partner Violence: Not on file     Outpatient Medications Prior to Visit  Medication Sig Dispense Refill   dapagliflozin  propanediol (FARXIGA ) 10 MG TABS tablet Take 1 tablet (10 mg total) by mouth daily before breakfast. 90 tablet 1   glimepiride  (AMARYL ) 4 MG tablet TAKE 1 TABLET BY MOUTH ONCE DAILY BEFORE BREAKFAST 90 tablet 0   lisinopril  (ZESTRIL ) 30 MG tablet Take 1 tablet (30 mg total) by mouth daily. 90 tablet 1   metFORMIN  (GLUCOPHAGE -XR) 500 MG 24 hr tablet Take 1 tablet by mouth twice daily 180 tablet 0   rosuvastatin  (CRESTOR ) 20 MG tablet Take 1 tablet by mouth once daily 90 tablet 0   No facility-administered medications prior to visit.    No Known Allergies  ROS Review of Systems Negative unless indicated in HPI.    Objective:    Physical Exam Constitutional:      Appearance: Normal  appearance.  HENT:     Mouth/Throat:     Mouth: Mucous membranes are moist.  Eyes:     Conjunctiva/sclera: Conjunctivae normal.     Pupils: Pupils are equal, round, and reactive to light.  Cardiovascular:     Rate and Rhythm: Normal rate and regular rhythm.     Pulses: Normal pulses.     Heart sounds: Normal heart sounds.  Pulmonary:     Effort: Pulmonary effort is normal.     Breath sounds: Normal breath sounds.  Abdominal:     General: Bowel sounds are normal.     Palpations: Abdomen is soft.  Musculoskeletal:     Cervical back: Normal range of motion. No tenderness.  Skin:    General: Skin is warm.     Findings: No bruising.  Neurological:     General: No focal deficit present.     Mental Status: She is alert and oriented to person, place, and time. Mental status is at baseline.  Psychiatric:        Mood and Affect: Mood  normal.        Behavior: Behavior normal.        Thought Content: Thought content normal.        Judgment: Judgment normal.     BP 126/78   Pulse 80   Temp 97.9 F (36.6 C)   Ht 5\' 9"  (1.753 m)   Wt 226 lb 6.4 oz (102.7 kg)   LMP 01/19/2016 (Exact Date)   SpO2 99%   BMI 33.43 kg/m  Wt Readings from Last 3 Encounters:  12/13/23 226 lb 6.4 oz (102.7 kg)  09/13/23 231 lb 9.6 oz (105.1 kg)  03/08/23 223 lb 9.6 oz (101.4 kg)     Health Maintenance  Topic Date Due   HIV Screening  Never done   Pneumococcal Vaccine 8-61 Years old (1 of 2 - PCV) Never done   Colonoscopy  Never done   Zoster Vaccines- Shingrix (1 of 2) Never done   OPHTHALMOLOGY EXAM  05/31/2017   COVID-19 Vaccine (3 - 2024-25 season) 12/26/2023 (Originally 04/22/2023)   INFLUENZA VACCINE  03/21/2024   HEMOGLOBIN A1C  06/13/2024   Diabetic kidney evaluation - eGFR measurement  09/12/2024   Diabetic kidney evaluation - Urine ACR  09/12/2024   FOOT EXAM  09/12/2024   MAMMOGRAM  04/04/2025   Hepatitis C Screening  Completed   HPV VACCINES  Aged Out   Meningococcal B Vaccine  Aged Out   DTaP/Tdap/Td  Discontinued    There are no preventive care reminders to display for this patient.  Lab Results  Component Value Date   TSH 0.54 09/13/2023   Lab Results  Component Value Date   WBC 6.0 09/13/2023   HGB 14.6 09/13/2023   HCT 43.7 09/13/2023   MCV 96.4 09/13/2023   PLT 171.0 09/13/2023   Lab Results  Component Value Date   NA 137 09/13/2023   K 4.8 09/13/2023   CO2 26 09/13/2023   GLUCOSE 168 (H) 09/13/2023   BUN 28 (H) 09/13/2023   CREATININE 0.91 09/13/2023   BILITOT 0.3 09/13/2023   ALKPHOS 86 09/13/2023   AST 20 09/13/2023   ALT 27 09/13/2023   PROT 7.1 09/13/2023   ALBUMIN 4.1 09/13/2023   CALCIUM  9.5 09/13/2023   ANIONGAP 14 03/02/2016   EGFR 84 07/06/2022   GFR 68.37 09/13/2023   Lab Results  Component Value Date   CHOL 147 12/13/2023   Lab Results  Component Value Date  HDL  55.60 12/13/2023   Lab Results  Component Value Date   LDLCALC 66 12/13/2023   Lab Results  Component Value Date   TRIG 126.0 12/13/2023   Lab Results  Component Value Date   CHOLHDL 3 12/13/2023   Lab Results  Component Value Date   HGBA1C 10.7 (H) 12/13/2023      Assessment & Plan:  Type 2 diabetes mellitus with hyperglycemia, without long-term current use of insulin (HCC) Assessment & Plan: Fasting Blood glucose 180-200. Currently on Metformin , Farxiga , and Glimepiride . Compliance issue with Farxiga . Advised to take medication consistently use alarm , pill organizer.  -Check A1C today. -Nutrition counseling provided.  -Continue Metformin , Farxiga , and Glimepiride . -Pt declined to add insulin at present.   Orders: -     Hemoglobin A1c -     Lipid panel  Hyperlipidemia, unspecified hyperlipidemia type Assessment & Plan: Elevated trigs during previous lab. -Continue Rosuvastatin  for cardiovascular risk reduction.  -Will check lipid panel.     Follow-up: No follow-ups on file.   Tata Timmins, NP

## 2023-12-25 ENCOUNTER — Encounter: Payer: Self-pay | Admitting: Nurse Practitioner

## 2023-12-26 DIAGNOSIS — E1165 Type 2 diabetes mellitus with hyperglycemia: Secondary | ICD-10-CM | POA: Insufficient documentation

## 2023-12-26 NOTE — Assessment & Plan Note (Signed)
 Fasting Blood glucose 180-200. Currently on Metformin , Farxiga , and Glimepiride . Compliance issue with Farxiga . Advised to take medication consistently use alarm , pill organizer.  -Check A1C today. -Nutrition counseling provided.  -Continue Metformin , Farxiga , and Glimepiride . -Pt declined to add insulin at present.

## 2023-12-26 NOTE — Assessment & Plan Note (Signed)
 Elevated trigs during previous lab. -Continue Rosuvastatin  for cardiovascular risk reduction.  -Will check lipid panel.

## 2024-01-08 ENCOUNTER — Encounter: Payer: Self-pay | Admitting: Nurse Practitioner

## 2024-01-17 ENCOUNTER — Ambulatory Visit: Admitting: Nurse Practitioner

## 2024-01-18 ENCOUNTER — Other Ambulatory Visit: Payer: Self-pay | Admitting: Nurse Practitioner

## 2024-01-18 DIAGNOSIS — E782 Mixed hyperlipidemia: Secondary | ICD-10-CM

## 2024-01-24 ENCOUNTER — Ambulatory Visit: Admitting: Nurse Practitioner

## 2024-02-22 ENCOUNTER — Other Ambulatory Visit: Payer: Self-pay | Admitting: Nurse Practitioner

## 2024-02-22 DIAGNOSIS — E119 Type 2 diabetes mellitus without complications: Secondary | ICD-10-CM

## 2024-05-22 ENCOUNTER — Other Ambulatory Visit: Payer: Self-pay | Admitting: Nurse Practitioner

## 2024-05-22 DIAGNOSIS — E119 Type 2 diabetes mellitus without complications: Secondary | ICD-10-CM

## 2024-06-06 ENCOUNTER — Other Ambulatory Visit: Payer: Self-pay | Admitting: Nurse Practitioner

## 2024-07-01 ENCOUNTER — Other Ambulatory Visit: Payer: Self-pay | Admitting: Nurse Practitioner

## 2024-07-01 DIAGNOSIS — E782 Mixed hyperlipidemia: Secondary | ICD-10-CM

## 2024-07-13 ENCOUNTER — Other Ambulatory Visit: Payer: Self-pay | Admitting: Nurse Practitioner

## 2024-08-01 ENCOUNTER — Telehealth: Payer: Self-pay | Admitting: Nurse Practitioner

## 2024-08-01 NOTE — Telephone Encounter (Signed)
 Left message to call the office to schedule an appointment for A1c follow up.

## 2024-08-04 NOTE — Telephone Encounter (Signed)
Left another message to call the office back.

## 2024-08-04 NOTE — Telephone Encounter (Signed)
 Left message to call the office back.

## 2024-08-05 NOTE — Telephone Encounter (Signed)
 Patient is scheduled for a A1c follow up on 09/19/23.

## 2024-09-03 ENCOUNTER — Other Ambulatory Visit: Payer: Self-pay | Admitting: Nurse Practitioner

## 2024-09-18 ENCOUNTER — Encounter: Payer: Self-pay | Admitting: Nurse Practitioner

## 2024-09-18 ENCOUNTER — Ambulatory Visit: Admitting: Nurse Practitioner

## 2024-09-18 VITALS — BP 118/72 | HR 86 | Temp 98.4°F | Ht 69.0 in | Wt 221.0 lb

## 2024-09-18 DIAGNOSIS — E119 Type 2 diabetes mellitus without complications: Secondary | ICD-10-CM | POA: Diagnosis not present

## 2024-09-18 DIAGNOSIS — E785 Hyperlipidemia, unspecified: Secondary | ICD-10-CM

## 2024-09-18 DIAGNOSIS — Z114 Encounter for screening for human immunodeficiency virus [HIV]: Secondary | ICD-10-CM

## 2024-09-18 DIAGNOSIS — E782 Mixed hyperlipidemia: Secondary | ICD-10-CM

## 2024-09-18 DIAGNOSIS — I10 Essential (primary) hypertension: Secondary | ICD-10-CM | POA: Diagnosis not present

## 2024-09-18 DIAGNOSIS — Z7984 Long term (current) use of oral hypoglycemic drugs: Secondary | ICD-10-CM

## 2024-09-18 DIAGNOSIS — Z23 Encounter for immunization: Secondary | ICD-10-CM

## 2024-09-18 LAB — COMPREHENSIVE METABOLIC PANEL WITH GFR
ALT: 31 U/L (ref 3–35)
AST: 21 U/L (ref 5–37)
Albumin: 4.3 g/dL (ref 3.5–5.2)
Alkaline Phosphatase: 69 U/L (ref 39–117)
BUN: 30 mg/dL — ABNORMAL HIGH (ref 6–23)
CO2: 26 meq/L (ref 19–32)
Calcium: 10.1 mg/dL (ref 8.4–10.5)
Chloride: 102 meq/L (ref 96–112)
Creatinine, Ser: 0.84 mg/dL (ref 0.40–1.20)
GFR: 74.73 mL/min
Glucose, Bld: 225 mg/dL — ABNORMAL HIGH (ref 70–99)
Potassium: 5.1 meq/L (ref 3.5–5.1)
Sodium: 135 meq/L (ref 135–145)
Total Bilirubin: 0.5 mg/dL (ref 0.2–1.2)
Total Protein: 7.4 g/dL (ref 6.0–8.3)

## 2024-09-18 LAB — CBC WITH DIFFERENTIAL/PLATELET
Basophils Absolute: 0 10*3/uL (ref 0.0–0.1)
Basophils Relative: 0.3 % (ref 0.0–3.0)
Eosinophils Absolute: 0.1 10*3/uL (ref 0.0–0.7)
Eosinophils Relative: 2 % (ref 0.0–5.0)
HCT: 45.7 % (ref 36.0–46.0)
Hemoglobin: 15.2 g/dL — ABNORMAL HIGH (ref 12.0–15.0)
Lymphocytes Relative: 29.6 % (ref 12.0–46.0)
Lymphs Abs: 1.7 10*3/uL (ref 0.7–4.0)
MCHC: 33.2 g/dL (ref 30.0–36.0)
MCV: 95.2 fl (ref 78.0–100.0)
Monocytes Absolute: 0.5 10*3/uL (ref 0.1–1.0)
Monocytes Relative: 9.4 % (ref 3.0–12.0)
Neutro Abs: 3.3 10*3/uL (ref 1.4–7.7)
Neutrophils Relative %: 58.7 % (ref 43.0–77.0)
Platelets: 176 10*3/uL (ref 150.0–400.0)
RBC: 4.8 Mil/uL (ref 3.87–5.11)
RDW: 13.5 % (ref 11.5–15.5)
WBC: 5.6 10*3/uL (ref 4.0–10.5)

## 2024-09-18 LAB — MICROALBUMIN / CREATININE URINE RATIO
Creatinine,U: 43.9 mg/dL
Microalb Creat Ratio: UNDETERMINED mg/g (ref 0.0–30.0)
Microalb, Ur: <0.7 mg/dL

## 2024-09-18 LAB — LIPID PANEL
Cholesterol: 138 mg/dL (ref 28–200)
HDL: 41.4 mg/dL
LDL Cholesterol: 53 mg/dL (ref 10–99)
NonHDL: 96.42
Total CHOL/HDL Ratio: 3
Triglycerides: 217 mg/dL — ABNORMAL HIGH (ref 10.0–149.0)
VLDL: 43.4 mg/dL — ABNORMAL HIGH (ref 0.0–40.0)

## 2024-09-18 LAB — TSH: TSH: 0.61 u[IU]/mL (ref 0.35–5.50)

## 2024-09-18 LAB — HEMOGLOBIN A1C: Hgb A1c MFr Bld: 10.2 % — ABNORMAL HIGH (ref 4.6–6.5)

## 2024-09-18 MED ORDER — GLIMEPIRIDE 4 MG PO TABS: 4.0000 mg | ORAL_TABLET | Freq: Every day | ORAL | 3 refills | Status: AC

## 2024-09-18 MED ORDER — LISINOPRIL 30 MG PO TABS: 30.0000 mg | ORAL_TABLET | Freq: Every day | ORAL | 3 refills | Status: AC

## 2024-09-18 MED ORDER — FARXIGA 10 MG PO TABS: 10.0000 mg | ORAL_TABLET | Freq: Every day | ORAL | 3 refills | Status: AC

## 2024-09-18 MED ORDER — METFORMIN HCL ER 500 MG PO TB24: 500.0000 mg | ORAL_TABLET | Freq: Two times a day (BID) | ORAL | 3 refills | Status: AC

## 2024-09-18 MED ORDER — ROSUVASTATIN CALCIUM 20 MG PO TABS: 20.0000 mg | ORAL_TABLET | Freq: Every day | ORAL | 3 refills | Status: AC

## 2024-09-18 NOTE — Assessment & Plan Note (Deleted)
 Orders:    rosuvastatin  (CRESTOR ) 20 MG tablet; Take 1 tablet (20 mg total) by mouth daily.

## 2024-09-18 NOTE — Progress Notes (Signed)
 "  Established Patient Office Visit  Subjective:  Patient ID: Kimberly Rowe, female    DOB: [birth year]  Age: 64 y.o. MRN: [id number]  CC:  Chief Complaint  Patient presents with   Medical Management of Chronic Issues   Discussed the use of AI scribe software for clinical note transcription with the patient, who gave verbal consent to proceed.  History of Present Illness Discussed the use of AI scribe software for clinical note transcription with the patient, who gave verbal consent to proceed.  History of Present Illness   Kimberly Rowe is a 64 year old female with diabetes who presents for a regular follow-up and A1c check.  She reports fluctuating fasting blood sugars in the morning, generally in the high 100s to low 200s, with occasional readings up to 220. She describes her levels as going up and down.  Her current regimen is Farxiga , glimepiride , and lisinopril  in the morning, metformin  and rosuvastatin  at night. She is working on diet and taking medications as prescribed. She usually eats breakfast at 7 to 8 AM but sometimes skips it if not hungry.  She reports a total loss of about 10 lb with increased exercise. She is interested in attending a diabetes education class to improve self-management.  She denies numbness or tingling in the extremities. She had a hysterectomy about nine years ago, when diabetes was first diagnosed with blood sugars around 400.      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   BREAST BIOPSY Left 07/04/2016   Stereo- path unknown   TONSILLECTOMY      Family History  Problem Relation Age of Onset   Prostate cancer Father    Arthritis Maternal Grandfather    Breast cancer Neg Hx     Social History   Socioeconomic History   Marital status: Married    Spouse name: Not on file   Number of children: Not on file   Years of education: Not on file   Highest education level: Not on file  Occupational History   Not on file  Tobacco Use   Smoking status: Never   Smokeless tobacco: Never  Substance and Sexual Activity   Alcohol use: Yes    Comment: occ   Drug use: No   Sexual activity: Yes    Partners: Male  Other Topics Concern   Not on file  Social History Narrative   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    Social Drivers of Health   Tobacco Use: Low Risk (09/18/2024)   Patient History    Smoking Tobacco Use: Never    Smokeless Tobacco Use: Never    Passive Exposure: Not on file  Financial Resource Strain: Not on file  Food Insecurity: Not on file  Transportation Needs: Not on file  Physical Activity: Not on file  Stress: Not on file  Social Connections: Not on file  Intimate Partner Violence: Not on file  Depression (PHQ2-9): Low Risk (09/18/2024)   Depression (PHQ2-9)    PHQ-2 Score: 0  Alcohol Screen: Not on file  Housing: Not on file  Utilities: Not on file  Health Literacy: Not on file     Outpatient Medications Prior to Visit  Medication Sig Dispense Refill   FARXIGA  10 MG TABS tablet TAKE 1 TABLET BY MOUTH ONCE DAILY BEFORE BREAKFAST 90 tablet 0   glimepiride  (AMARYL ) 4 MG tablet TAKE 1 TABLET BY MOUTH ONCE DAILY BEFORE BREAKFAST 90 tablet 0   lisinopril  (ZESTRIL ) 30 MG tablet Take 1 tablet by mouth once daily 90 tablet 0   metFORMIN  (GLUCOPHAGE -XR) 500 MG 24 hr tablet Take 1 tablet by mouth twice daily 180 tablet 0   rosuvastatin   (CRESTOR ) 20 MG tablet Take 1 tablet by mouth once daily 90 tablet 0   No facility-administered medications prior to visit.    Allergies[1]  ROS Review of Systems Negative unless indicated in HPI.    Objective:    Physical Exam Constitutional:      Appearance: Normal appearance.  HENT:     Mouth/Throat:     Mouth: Mucous membranes are moist.  Eyes:     Conjunctiva/sclera: Conjunctivae normal.     Pupils: Pupils are equal, round, and reactive to light.  Cardiovascular:     Rate and Rhythm: Normal rate and regular rhythm.     Pulses: Normal pulses.     Heart sounds: Normal heart sounds.  Pulmonary:     Effort: Pulmonary effort is normal.     Breath sounds: Normal breath sounds.  Abdominal:     General: Bowel sounds are normal.     Palpations: Abdomen is soft.  Musculoskeletal:     Cervical back: Normal range of motion. No tenderness.     Right lower leg: No edema.     Left lower leg: No edema.  Skin:    General: Skin is warm.     Findings: No bruising.  Neurological:     General: No focal deficit present.     Mental Status: She is alert and oriented to person, place, and time. Mental status is at baseline.  Psychiatric:        Mood and Affect: Mood normal.        Behavior: Behavior normal.        Thought Content: Thought content normal.        Judgment: Judgment normal.     BP 118/72   Pulse 86   Temp 98.4 F (36.9 C)   Ht 5' 9 (1.753 m)   Wt 221 lb (100.2 kg)   LMP 01/19/2016   SpO2 99%   BMI 32.64 kg/m  Wt Readings from Last 3 Encounters:  09/18/24 221 lb (100.2 kg)  12/13/23 226 lb 6.4 oz (102.7 kg)  09/13/23 231 lb 9.6 oz (105.1 kg)     Health Maintenance  Topic Date Due   HIV Screening  Never done   OPHTHALMOLOGY EXAM  05/31/2017   COVID-19 Vaccine (3 - 2025-26 season) 10/04/2024 (Originally 04/21/2024)   Influenza Vaccine  11/18/2024 (Originally 03/21/2024)   Zoster Vaccines- Shingrix (1 of 2) 12/17/2024 (Originally 10/10/2012)   Pneumococcal  Vaccine: 50+ Years (1 of 2 - PCV) 09/18/2025 (Originally 10/10/1981)   Colonoscopy  09/18/2025 (Originally 10/11/2007)   HEMOGLOBIN A1C  03/18/2025   Mammogram  04/04/2025  Diabetic kidney evaluation - eGFR measurement  09/18/2025   Diabetic kidney evaluation - Urine ACR  09/18/2025   FOOT EXAM  09/18/2025   HPV VACCINES (No Doses Required) Completed   Hepatitis C Screening  Completed   Hepatitis B Vaccines 19-59 Average Risk  Aged Out   Meningococcal B Vaccine  Aged Out   DTaP/Tdap/Td  Discontinued    There are no preventive care reminders to display for this patient.  Lab Results  Component Value Date   TSH 0.61 09/18/2024   Lab Results  Component Value Date   WBC 5.6 09/18/2024   HGB 15.2 (H) 09/18/2024   HCT 45.7 09/18/2024   MCV 95.2 09/18/2024   PLT 176.0 09/18/2024   Lab Results  Component Value Date   NA 135 09/18/2024   K 5.1 09/18/2024   CO2 26 09/18/2024   GLUCOSE 225 (H) 09/18/2024   BUN 30 (H) 09/18/2024   CREATININE 0.84 09/18/2024   BILITOT 0.5 09/18/2024   ALKPHOS 69 09/18/2024   AST 21 09/18/2024   ALT 31 09/18/2024   PROT 7.4 09/18/2024   ALBUMIN 4.3 09/18/2024   CALCIUM  10.1 09/18/2024   ANIONGAP 14 03/02/2016   EGFR 84 07/06/2022   GFR 74.73 09/18/2024   Lab Results  Component Value Date   CHOL 138 09/18/2024   Lab Results  Component Value Date   HDL 41.40 09/18/2024   Lab Results  Component Value Date   LDLCALC 53 09/18/2024   Lab Results  Component Value Date   TRIG 217.0 (H) 09/18/2024   Lab Results  Component Value Date   CHOLHDL 3 09/18/2024   Lab Results  Component Value Date   HGBA1C 10.2 (H) 09/18/2024      Assessment & Plan:   Assessment & Plan Essential hypertension Chronic stable on medication. Vitals:   09/18/24 1000  BP: 118/72  -Advised pt to follow a low sodium and heart healthy diet. -Continue lisinopril  30 mg daily. -Will check labs as outlined  Orders:   TSH   Comprehensive metabolic panel    CBC with Differential/Platelet  Type 2 diabetes mellitus without complication, without long-term current use of insulin (HCC) Previous A1c 10.7%  on 12/13/23. She politely declined continuous glucose monitoring and insulin.  Denies polyuria, polyphagia, polydipsia, neuropathy, or retinopathy. - Ordered A1c test. - Referred to diabetic education classes. - Continue Farxiga , metformin , glimepiride . - Encouraged dietary modifications: increase fresh fruits, avoid packed foods, consider low-sugar options. - Encouraged regular exercise. - Scheduled follow-up in 3 months to reassess A1c and treatment plan Orders:   Microalbumin / creatinine urine ratio   Hemoglobin A1c   glimepiride  (AMARYL ) 4 MG tablet; Take 1 tablet (4 mg total) by mouth daily before breakfast.   Ambulatory referral to diabetic education  Hyperlipidemia, unspecified hyperlipidemia type Chronic on statin therapy. -Continue rosuvastatin  20 mg daily. -Will check lipid panel. Orders:   Lipid panel   rosuvastatin  (CRESTOR ) 20 MG tablet; Take 1 tablet (20 mg total) by mouth daily.  Encounter for screening for HIV  Orders:   HIV antibody (with reflex)  Declined COVID, shingles, pneumonia vaccines, and colonoscopy. Mammogram completed last year.  Patient reports no diabetic retinopathy per  ophthalmology.  Will get records from [facility] - Ordered HIV screening. - Encouraged regular exercise and healthy diet. - Discussed importance of vaccinations and screenings. Assessment and Plan Assessment & Plan    Follow-up: Return in about 3 months (around 12/17/2024), or if symptoms worsen or fail to improve, for  diabetes.   Chelsea Aurora, NP      [1] No Known Allergies  "

## 2024-09-18 NOTE — Assessment & Plan Note (Addendum)
 Chronic on statin therapy. -Continue rosuvastatin  20 mg daily. -Will check lipid panel. Orders:   Lipid panel   rosuvastatin  (CRESTOR ) 20 MG tablet; Take 1 tablet (20 mg total) by mouth daily.

## 2024-09-18 NOTE — Assessment & Plan Note (Addendum)
 Chronic stable on medication. Vitals:   09/18/24 1000  BP: 118/72  -Advised pt to follow a low sodium and heart healthy diet. -Continue lisinopril  30 mg daily. -Will check labs as outlined  Orders:   TSH   Comprehensive metabolic panel   CBC with Differential/Platelet

## 2024-09-18 NOTE — Assessment & Plan Note (Addendum)
 Previous A1c 10.7%  on 12/13/23. She politely declined continuous glucose monitoring and insulin.  Denies polyuria, polyphagia, polydipsia, neuropathy, or retinopathy. - Ordered A1c test. - Referred to diabetic education classes. - Continue Farxiga , metformin , glimepiride . - Encouraged dietary modifications: increase fresh fruits, avoid packed foods, consider low-sugar options. - Encouraged regular exercise. - Scheduled follow-up in 3 months to reassess A1c and treatment plan Orders:   Microalbumin / creatinine urine ratio   Hemoglobin A1c   glimepiride  (AMARYL ) 4 MG tablet; Take 1 tablet (4 mg total) by mouth daily before breakfast.   Ambulatory referral to diabetic education

## 2024-09-23 ENCOUNTER — Ambulatory Visit: Payer: Self-pay | Admitting: Nurse Practitioner

## 2024-09-23 NOTE — Telephone Encounter (Signed)
 Copied from CRM [id number]. Topic: Appointments - Scheduling Inquiry for Clinic >> Sep 23, 2024  1:33 PM Emylou G wrote: Reason for CRM: Patient called.. returned our call.. looking for appt for diabetes/nutrition.  She wasn't why we called..please call her back

## 2024-09-23 NOTE — Progress Notes (Signed)
 Please inform pt:  Her A1c decreased from 10.7 to 10.2 but still not under control, Please continue farxiga , glimipiride and metformin  daily. Triglycerides elevated. Continue diet management and exercise.  Electrolytes, Liver function, thyroid  and kidney normal.  No protein leakage from the kidneys.

## 2024-09-24 LAB — HIV-1 AND HIV-2 RNA, QUALITATIVE REAL-TIME PCR
HIV 1 RNA, QL RT PCR: NOT DETECTED
HIV 2 RNA, QL RT PCR: NOT DETECTED

## 2024-09-24 LAB — HIV ANTIBODY (ROUTINE TESTING W REFLEX)
HIV 1&2 Ab, 4th Generation: REACTIVE
HIV FINAL INTERPRETATION: NEGATIVE

## 2024-09-24 LAB — HIV-1/2 AB - DIFFERENTIATION
HIV-1 antibody: NEGATIVE
HIV-2 Ab: NEGATIVE

## 2024-12-18 ENCOUNTER — Ambulatory Visit: Admitting: Nurse Practitioner
